# Patient Record
Sex: Female | Born: 1970 | Race: White | Hispanic: No | Smoking: Never smoker
Health system: Southern US, Community
[De-identification: ages and names within clinical notes are randomized; demographics above are authoritative.]

## PROBLEM LIST (undated history)

## (undated) DIAGNOSIS — Z9882 Breast implant status: Secondary | ICD-10-CM

## (undated) DIAGNOSIS — J302 Other seasonal allergic rhinitis: Secondary | ICD-10-CM

## (undated) DIAGNOSIS — Z923 Personal history of irradiation: Secondary | ICD-10-CM

## (undated) DIAGNOSIS — C50911 Malignant neoplasm of unspecified site of right female breast: Secondary | ICD-10-CM

## (undated) DIAGNOSIS — C50919 Malignant neoplasm of unspecified site of unspecified female breast: Secondary | ICD-10-CM

## (undated) HISTORY — PX: BREAST LUMPECTOMY: SHX2

## (undated) HISTORY — PX: TONSILLECTOMY: SUR1361

---

## 1998-03-13 ENCOUNTER — Other Ambulatory Visit: Admission: RE | Admit: 1998-03-13 | Discharge: 1998-03-13 | Payer: Self-pay | Admitting: *Deleted

## 1999-04-12 ENCOUNTER — Other Ambulatory Visit: Admission: RE | Admit: 1999-04-12 | Discharge: 1999-04-12 | Payer: Self-pay | Admitting: *Deleted

## 1999-05-14 ENCOUNTER — Encounter: Payer: Self-pay | Admitting: Emergency Medicine

## 1999-05-14 ENCOUNTER — Emergency Department (HOSPITAL_COMMUNITY): Admission: EM | Admit: 1999-05-14 | Discharge: 1999-05-14 | Payer: Self-pay | Admitting: Emergency Medicine

## 2000-09-11 ENCOUNTER — Other Ambulatory Visit: Admission: RE | Admit: 2000-09-11 | Discharge: 2000-09-11 | Payer: Self-pay | Admitting: *Deleted

## 2001-09-17 ENCOUNTER — Other Ambulatory Visit: Admission: RE | Admit: 2001-09-17 | Discharge: 2001-09-17 | Payer: Self-pay | Admitting: *Deleted

## 2002-05-07 ENCOUNTER — Other Ambulatory Visit: Admission: RE | Admit: 2002-05-07 | Discharge: 2002-05-07 | Payer: Self-pay | Admitting: Obstetrics & Gynecology

## 2002-10-28 ENCOUNTER — Ambulatory Visit (HOSPITAL_COMMUNITY): Admission: RE | Admit: 2002-10-28 | Discharge: 2002-10-28 | Payer: Self-pay | Admitting: Obstetrics & Gynecology

## 2002-10-28 ENCOUNTER — Encounter: Payer: Self-pay | Admitting: Obstetrics & Gynecology

## 2002-10-30 ENCOUNTER — Inpatient Hospital Stay (HOSPITAL_COMMUNITY): Admission: AD | Admit: 2002-10-30 | Discharge: 2002-11-01 | Payer: Self-pay | Admitting: Obstetrics & Gynecology

## 2004-06-25 ENCOUNTER — Other Ambulatory Visit: Admission: RE | Admit: 2004-06-25 | Discharge: 2004-06-25 | Payer: Self-pay | Admitting: Obstetrics and Gynecology

## 2004-07-25 HISTORY — PX: BREAST ENHANCEMENT SURGERY: SHX7

## 2005-07-07 ENCOUNTER — Other Ambulatory Visit: Admission: RE | Admit: 2005-07-07 | Discharge: 2005-07-07 | Payer: Self-pay | Admitting: Obstetrics and Gynecology

## 2014-10-01 ENCOUNTER — Other Ambulatory Visit: Payer: Self-pay | Admitting: Obstetrics and Gynecology

## 2014-10-01 DIAGNOSIS — R928 Other abnormal and inconclusive findings on diagnostic imaging of breast: Secondary | ICD-10-CM

## 2014-10-07 ENCOUNTER — Ambulatory Visit
Admission: RE | Admit: 2014-10-07 | Discharge: 2014-10-07 | Disposition: A | Discharge status: Home / Self Care | Payer: BLUE CROSS/BLUE SHIELD | Source: Ambulatory Visit | Attending: Obstetrics and Gynecology | Admitting: Obstetrics and Gynecology

## 2014-10-07 ENCOUNTER — Other Ambulatory Visit: Payer: Self-pay | Admitting: Obstetrics and Gynecology

## 2014-10-07 DIAGNOSIS — R928 Other abnormal and inconclusive findings on diagnostic imaging of breast: Secondary | ICD-10-CM

## 2014-10-14 ENCOUNTER — Ambulatory Visit: Payer: Self-pay | Admitting: Surgery

## 2014-10-14 DIAGNOSIS — R920 Mammographic microcalcification found on diagnostic imaging of breast: Secondary | ICD-10-CM

## 2014-10-14 NOTE — H&P (Signed)
History of Present Illness Kara Mccullough. Kara Menchaca MD; 10/14/2014 2:51 PM) Patient words: breast.  The patient is a 44 year old female who presents with a complaint of breast problems. Referred by Dr. Curlene Dolphin for abnormal microcalcifications. PCP - Dr. Shelia Media  This is a 44 year old female who presents after a routine screening mammogram. She had abnormal microcalcifications in the upper medial part of her right breast. An attempt was made at stereotactic biopsy was unsuccessful because of her retropectoral implants. The microcalcifications are not seen on ultrasound. She has a dominant group of calcifications spanning 11 mm at 1:00. There are a few clustered calcifications medial and anterior to this initial group. The total area measures 2.4 x 3.3 x 3.8 cm. These are suspicious for DCIS. She is now referred to [place] for consideration of seed-localized biopsy.  Menarche - 37 First pregnancy - 31 Breastfeed - yes Hormones - OCP 12+ years Family hx - negative  CLINICAL DATA: 44 year old patient recalled from screening for calcifications in the right breast. The patient is asymptomatic.  EXAM: DIGITAL DIAGNOSTIC RIGHT MAMMOGRAM WITH IMPLANTS, 3D TOMOSYNTHESIS WITH CAD  The patient has retropectoral implants. Implant displaced views were performed.  COMPARISON: 09/29/2014 and 08/15/2013  ACR Breast Density Category c: The breast tissue is heterogeneously dense, which may obscure small masses.  FINDINGS: Magnification views of the right breast demonstrate pleomorphic and casting type calcifications in the upper central to slightly medial right breast. The dominant group of calcifications spans 11 mm in the approximate 1 o'clock position of the right breast. Magnification views reveal additional but less numerous calcifications medial and anterior to the dominant group of calcifications. There are a few clustered calcifications in the immediate retroareolar right breast. In  total, the calcifications of concern span 2.4 cm transverse diameter by 3.3 cm superior to inferior via 3.8 cm AP diameter. These calcifications have a segmental distribution and are suspicious for ductal carcinoma in situ. Implant displaced whole breast tomogram views demonstrate no evidence of mass or distortion in the right breast.  Mammographic images were processed with CAD.  IMPRESSION: Suspicious appearance and distribution of calcifications in the right breast. These are suspicious for malignancy and biopsy is recommended.  RECOMMENDATION: Stereotactic biopsy of right breast calcifications is recommended. The patient is able to stay for attempt at biopsy today. Given the thin compression of the breast with implant displaced, stereotactic biopsy may not be possible, but will be attempted.  I have discussed the findings and recommendations with the patient. Results were also provided in writing at the conclusion of the visit. If applicable, a reminder letter will be sent to the patient regarding the next appointment.  BI-RADS CATEGORY 4: Suspicious.  Electronically Signed: By: Curlene Dolphin M.D. On: 10/07/2014 14:05   ADDENDUM REPORT: 10/07/2014 14:26  ADDENDUM: Stereotactic biopsy of right breast calcifications was attempted. The breast was placed in multiple different positions within the stereotactic unit. The breast was positioned, and stereotactic biopsy with the petite needle was planned, and an incision was made, but when attempting to advance the needle into its position for biopsy, the thin nature of the breast in combination with its compressibility, precluded with the ability to biopsy safely. Therefore, biopsy was canceled. I took the patient to the ultrasound room to see if calcifications were visible. I could not detect the cluster of calcifications on ultrasound and there was no mass.  Therefore, the patient will be referred to [facility] for consultation for excisional biopsy. She has an appointment  with Dr. Ninfa Linden on October 27, 2014.   Electronically Signed By: Curlene Dolphin M.D. On: 10/07/2014 14:26 Other Problems (Ammie Eversole, LPN; 11/30/3265 1:24 PM) No pertinent past medical history  Past Surgical History (Ammie Eversole, LPN; 5/80/9983 3:82 PM) Breast Augmentation Bilateral.  Diagnostic Studies History (Ammie Eversole, LPN; 11/27/3974 7:34 PM) Colonoscopy never Mammogram within last year Pap Smear 1-5 years ago  Allergies (Ammie Eversole, LPN; 1/93/7902 4:09 PM) No Known Drug Allergies 10/14/2014  Medication History (Ammie Eversole, LPN; 7/35/3299 2:42 PM) Seasonal IC (Oral) Active.  Social History (Ammie Eversole, LPN; 6/83/4196 2:22 PM) Alcohol use Occasional alcohol use. Caffeine use Coffee. No drug use Tobacco use Never smoker.  Family History (Aleatha Borer, LPN; 9/79/8921 1:94 PM) Cancer Family Members In General. Hypertension Family Members In General. Respiratory Condition Family Members In General. Thyroid problems Mother.  Pregnancy / Birth History Aleatha Borer, LPN; 1/74/0814 4:81 PM) Age at menarche 52 years. Contraceptive History Oral contraceptives. Gravida 1 Maternal age 47-35 Para 1 Regular periods     Review of Systems (Ammie Eversole LPN; 8/56/3149 7:02 PM) General Not Present- Appetite Loss, Chills, Fatigue, Fever, Night Sweats, Weight Gain and Weight Loss. Skin Not Present- Change in Wart/Mole, Dryness, Hives, Jaundice, New Lesions, Non-Healing Wounds, Rash and Ulcer. HEENT Not Present- Earache, Hearing Loss, Hoarseness, Nose Bleed, Oral Ulcers, Ringing in the Ears, Seasonal Allergies, Sinus Pain, Sore Throat, Visual Disturbances, Wears glasses/contact lenses and Yellow Eyes. Respiratory Not Present- Bloody sputum, Chronic Cough, Difficulty Breathing, Snoring and Wheezing. Breast Present- Breast Mass. Not Present- Breast Pain, Nipple  Discharge and Skin Changes. Cardiovascular Not Present- Chest Pain, Difficulty Breathing Lying Down, Leg Cramps, Palpitations, Rapid Heart Rate, Shortness of Breath and Swelling of Extremities. Gastrointestinal Not Present- Abdominal Pain, Bloating, Bloody Stool, Change in Bowel Habits, Chronic diarrhea, Constipation, Difficulty Swallowing, Excessive gas, Gets full quickly at meals, Hemorrhoids, Indigestion, Nausea, Rectal Pain and Vomiting. Female Genitourinary Not Present- Frequency, Nocturia, Painful Urination, Pelvic Pain and Urgency. Musculoskeletal Not Present- Back Pain, Joint Pain, Joint Stiffness, Muscle Pain, Muscle Weakness and Swelling of Extremities. Neurological Not Present- Decreased Memory, Fainting, Headaches, Numbness, Seizures, Tingling, Tremor, Trouble walking and Weakness. Psychiatric Not Present- Anxiety, Bipolar, Change in Sleep Pattern, Depression, Fearful and Frequent crying. Endocrine Not Present- Cold Intolerance, Excessive Hunger, Hair Changes, Heat Intolerance, Hot flashes and New Diabetes. Hematology Not Present- Easy Bruising, Excessive bleeding, Gland problems, HIV and Persistent Infections.  Vitals (Ammie Eversole LPN; 6/37/8588 5:02 PM) 10/14/2014 1:53 PM Weight: 139.2 lb Height: 64in Body Surface Area: 1.69 m Body Mass Index: 23.89 kg/m Temp.: 46F(Oral)  Pulse: 80 (Regular)  BP: 134/84 (Sitting, Left Arm, Standard)     Physical Exam Rodman Key K. Cristhian Vanhook MD; 10/14/2014 2:52 PM)  The physical exam findings are as follows: Note:WDWN in NAD HEENT: EOMI, sclera anicteric Neck: No masses, no thyromegaly Lungs: CTA bilaterally; normal respiratory effort Breasts - symmetric; healed circumareolar incisions; no palpable masses; no axillary lymphadenopathy CV: Regular rate and rhythm; no murmurs Abd: +bowel sounds, soft, non-tender, no masses Ext: Well-perfused; no edema Skin: Warm, dry; no sign of jaundice    Assessment & Plan Rodman Key K. Makylee Sanborn  MD; 10/14/2014 2:52 PM)  MAMMOGRAPHIC MICROCALCIFICATION FOUND ON DIAGNOSTIC IMAGING OF BREAST (793.81  R92.0)  Current Plans Schedule for Surgery - right radioactive seed localized lumpectomy. The surgical procedure has been discussed with the patient. Potential risks, benefits, alternative treatments, and expected outcomes have been explained. All of the patient's questions at this time have been answered. The likelihood of reaching the  patient's treatment goal is good. The patient understand the proposed surgical procedure and wishes to proceed. Note:Clustering of microcalcifications is suspicious for DCIS.   Kara Mccullough. Georgette Dover, MD, [facility]  General/ Trauma Surgery  10/14/2014 2:53 PM

## 2014-10-20 ENCOUNTER — Other Ambulatory Visit: Payer: Self-pay | Admitting: Surgery

## 2014-10-20 DIAGNOSIS — R920 Mammographic microcalcification found on diagnostic imaging of breast: Secondary | ICD-10-CM

## 2014-10-21 ENCOUNTER — Encounter (HOSPITAL_BASED_OUTPATIENT_CLINIC_OR_DEPARTMENT_OTHER): Payer: Self-pay | Admitting: *Deleted

## 2014-10-21 NOTE — Progress Notes (Signed)
No labs needed

## 2014-10-23 ENCOUNTER — Ambulatory Visit
Admission: RE | Admit: 2014-10-23 | Discharge: 2014-10-23 | Disposition: A | Discharge status: Home / Self Care | Payer: BLUE CROSS/BLUE SHIELD | Source: Ambulatory Visit | Attending: Surgery | Admitting: Surgery

## 2014-10-23 DIAGNOSIS — R920 Mammographic microcalcification found on diagnostic imaging of breast: Secondary | ICD-10-CM

## 2014-10-24 ENCOUNTER — Ambulatory Visit
Admission: RE | Admit: 2014-10-24 | Discharge: 2014-10-24 | Disposition: A | Discharge status: Home / Self Care | Payer: BLUE CROSS/BLUE SHIELD | Source: Ambulatory Visit | Attending: Surgery | Admitting: Surgery

## 2014-10-24 ENCOUNTER — Ambulatory Visit (HOSPITAL_BASED_OUTPATIENT_CLINIC_OR_DEPARTMENT_OTHER)
Admission: RE | Admit: 2014-10-24 | Discharge: 2014-10-24 | Disposition: A | Discharge status: Home / Self Care | Payer: BLUE CROSS/BLUE SHIELD | Source: Ambulatory Visit | Attending: Surgery | Admitting: Surgery

## 2014-10-24 ENCOUNTER — Ambulatory Visit (HOSPITAL_BASED_OUTPATIENT_CLINIC_OR_DEPARTMENT_OTHER): Payer: BLUE CROSS/BLUE SHIELD | Admitting: Anesthesiology

## 2014-10-24 ENCOUNTER — Encounter (HOSPITAL_BASED_OUTPATIENT_CLINIC_OR_DEPARTMENT_OTHER)
Admission: RE | Disposition: A | Discharge status: Home / Self Care | Payer: Self-pay | Source: Ambulatory Visit | Attending: Surgery

## 2014-10-24 ENCOUNTER — Encounter (HOSPITAL_BASED_OUTPATIENT_CLINIC_OR_DEPARTMENT_OTHER): Payer: Self-pay

## 2014-10-24 DIAGNOSIS — Z9889 Other specified postprocedural states: Secondary | ICD-10-CM | POA: Diagnosis not present

## 2014-10-24 DIAGNOSIS — Z809 Family history of malignant neoplasm, unspecified: Secondary | ICD-10-CM | POA: Diagnosis not present

## 2014-10-24 DIAGNOSIS — D0511 Intraductal carcinoma in situ of right breast: Secondary | ICD-10-CM | POA: Insufficient documentation

## 2014-10-24 DIAGNOSIS — Z79899 Other long term (current) drug therapy: Secondary | ICD-10-CM | POA: Diagnosis not present

## 2014-10-24 DIAGNOSIS — R920 Mammographic microcalcification found on diagnostic imaging of breast: Secondary | ICD-10-CM | POA: Insufficient documentation

## 2014-10-24 DIAGNOSIS — C50919 Malignant neoplasm of unspecified site of unspecified female breast: Secondary | ICD-10-CM

## 2014-10-24 HISTORY — PX: BREAST LUMPECTOMY WITH RADIOACTIVE SEED LOCALIZATION: SHX6424

## 2014-10-24 HISTORY — DX: Malignant neoplasm of unspecified site of unspecified female breast: C50.919

## 2014-10-24 HISTORY — DX: Other seasonal allergic rhinitis: J30.2

## 2014-10-24 LAB — POCT HEMOGLOBIN-HEMACUE: Hemoglobin: 11.6 g/dL — ABNORMAL LOW (ref 12.0–15.0)

## 2014-10-24 SURGERY — BREAST LUMPECTOMY WITH RADIOACTIVE SEED LOCALIZATION
Anesthesia: General | Site: Breast | Laterality: Right

## 2014-10-24 MED ORDER — LIDOCAINE HCL (CARDIAC) 20 MG/ML IV SOLN
INTRAVENOUS | Status: DC | PRN
  Administered 2014-10-24: 100 mg via INTRAVENOUS

## 2014-10-24 MED ORDER — CHLORHEXIDINE GLUCONATE 4 % EX LIQD: 1.0000 "application " | Freq: Once | CUTANEOUS | Status: DC

## 2014-10-24 MED ORDER — FENTANYL CITRATE 0.05 MG/ML IJ SOLN
INTRAMUSCULAR | Status: AC
  Filled 2014-10-24: qty 4

## 2014-10-24 MED ORDER — OXYCODONE-ACETAMINOPHEN 5-325 MG PO TABS: 1.0000 | ORAL_TABLET | ORAL | Status: DC | PRN

## 2014-10-24 MED ORDER — PROPOFOL 10 MG/ML IV BOLUS
INTRAVENOUS | Status: DC | PRN
  Administered 2014-10-24: 160 mg via INTRAVENOUS

## 2014-10-24 MED ORDER — MORPHINE SULFATE 2 MG/ML IJ SOLN: 2.0000 mg | INTRAMUSCULAR | Status: DC | PRN

## 2014-10-24 MED ORDER — MEPERIDINE HCL 25 MG/ML IJ SOLN: 6.2500 mg | INTRAMUSCULAR | Status: DC | PRN

## 2014-10-24 MED ORDER — BUPIVACAINE-EPINEPHRINE 0.25% -1:200000 IJ SOLN
INTRAMUSCULAR | Status: DC | PRN
  Administered 2014-10-24: 8 mL

## 2014-10-24 MED ORDER — ONDANSETRON HCL 4 MG/2ML IJ SOLN
INTRAMUSCULAR | Status: DC | PRN
  Administered 2014-10-24: 4 mg via INTRAVENOUS

## 2014-10-24 MED ORDER — MIDAZOLAM HCL 2 MG/2ML IJ SOLN
INTRAMUSCULAR | Status: AC
  Filled 2014-10-24: qty 2

## 2014-10-24 MED ORDER — LACTATED RINGERS IV SOLN
INTRAVENOUS | Status: DC
  Administered 2014-10-24: 07:00:00 via INTRAVENOUS

## 2014-10-24 MED ORDER — MIDAZOLAM HCL 2 MG/2ML IJ SOLN: 1.0000 mg | INTRAMUSCULAR | Status: DC | PRN

## 2014-10-24 MED ORDER — FENTANYL CITRATE 0.05 MG/ML IJ SOLN: 50.0000 ug | INTRAMUSCULAR | Status: DC | PRN

## 2014-10-24 MED ORDER — HYDROMORPHONE HCL 1 MG/ML IJ SOLN: 0.2500 mg | INTRAMUSCULAR | Status: DC | PRN

## 2014-10-24 MED ORDER — CEFAZOLIN SODIUM-DEXTROSE 2-3 GM-% IV SOLR
2.0000 g | INTRAVENOUS | Status: AC
  Administered 2014-10-24: 2 g via INTRAVENOUS

## 2014-10-24 MED ORDER — CEFAZOLIN SODIUM-DEXTROSE 2-3 GM-% IV SOLR
INTRAVENOUS | Status: AC
  Filled 2014-10-24: qty 50

## 2014-10-24 MED ORDER — FENTANYL CITRATE 0.05 MG/ML IJ SOLN
INTRAMUSCULAR | Status: DC | PRN
  Administered 2014-10-24: 100 ug via INTRAVENOUS

## 2014-10-24 MED ORDER — DEXAMETHASONE SODIUM PHOSPHATE 4 MG/ML IJ SOLN
INTRAMUSCULAR | Status: DC | PRN
  Administered 2014-10-24: 10 mg via INTRAVENOUS

## 2014-10-24 MED ORDER — ONDANSETRON HCL 4 MG/2ML IJ SOLN: 4.0000 mg | Freq: Once | INTRAMUSCULAR | Status: DC | PRN

## 2014-10-24 MED ORDER — BUPIVACAINE-EPINEPHRINE (PF) 0.25% -1:200000 IJ SOLN
INTRAMUSCULAR | Status: AC
  Filled 2014-10-24: qty 30

## 2014-10-24 MED ORDER — ONDANSETRON HCL 4 MG/2ML IJ SOLN: 4.0000 mg | INTRAMUSCULAR | Status: DC | PRN

## 2014-10-24 MED ORDER — GLYCOPYRROLATE 0.2 MG/ML IJ SOLN
INTRAMUSCULAR | Status: DC | PRN
  Administered 2014-10-24: 0.2 mg via INTRAVENOUS

## 2014-10-24 MED ORDER — MIDAZOLAM HCL 5 MG/5ML IJ SOLN
INTRAMUSCULAR | Status: DC | PRN
  Administered 2014-10-24: 2 mg via INTRAVENOUS

## 2014-10-24 SURGICAL SUPPLY — 57 items
APL SKNCLS STERI-STRIP NONHPOA (GAUZE/BANDAGES/DRESSINGS) ×1
APPLIER CLIP 9.375 MED OPEN (MISCELLANEOUS) ×3
APR CLP MED 9.3 20 MLT OPN (MISCELLANEOUS) ×1
BENZOIN TINCTURE PRP APPL 2/3 (GAUZE/BANDAGES/DRESSINGS) ×3 IMPLANT
BLADE HEX COATED 2.75 (ELECTRODE) ×3 IMPLANT
BLADE SURG 15 STRL LF DISP TIS (BLADE) ×1 IMPLANT
BLADE SURG 15 STRL SS (BLADE) ×3
CANISTER SUCT 1200ML W/VALVE (MISCELLANEOUS) ×3 IMPLANT
CHLORAPREP W/TINT 26ML (MISCELLANEOUS) ×3 IMPLANT
CLIP APPLIE 9.375 MED OPEN (MISCELLANEOUS) ×1 IMPLANT
CLOSURE WOUND 1/2 X4 (GAUZE/BANDAGES/DRESSINGS) ×1
COVER BACK TABLE 60X90IN (DRAPES) ×3 IMPLANT
COVER MAYO STAND STRL (DRAPES) ×3 IMPLANT
COVER PROBE W GEL 5X96 (DRAPES) ×3 IMPLANT
DECANTER SPIKE VIAL GLASS SM (MISCELLANEOUS) IMPLANT
DEVICE DUBIN W/COMP PLATE 8390 (MISCELLANEOUS) ×3 IMPLANT
DRAPE LAPAROTOMY 100X72 PEDS (DRAPES) ×3 IMPLANT
DRAPE UTILITY XL STRL (DRAPES) ×3 IMPLANT
DRSG TEGADERM 4X4.75 (GAUZE/BANDAGES/DRESSINGS) ×3 IMPLANT
ELECT REM PT RETURN 9FT ADLT (ELECTROSURGICAL) ×3
ELECTRODE REM PT RTRN 9FT ADLT (ELECTROSURGICAL) ×1 IMPLANT
GLOVE BIO SURGEON STRL SZ7 (GLOVE) ×3 IMPLANT
GLOVE BIOGEL PI IND STRL 6.5 (GLOVE) IMPLANT
GLOVE BIOGEL PI IND STRL 7.0 (GLOVE) IMPLANT
GLOVE BIOGEL PI IND STRL 7.5 (GLOVE) ×1 IMPLANT
GLOVE BIOGEL PI INDICATOR 6.5 (GLOVE) ×2
GLOVE BIOGEL PI INDICATOR 7.0 (GLOVE) ×2
GLOVE BIOGEL PI INDICATOR 7.5 (GLOVE) ×2
GLOVE ECLIPSE 6.5 STRL STRAW (GLOVE) ×2 IMPLANT
GLOVE EXAM NITRILE EXT CUFF MD (GLOVE) ×2 IMPLANT
GLOVE SURG SS PI 6.0 STRL IVOR (GLOVE) ×2 IMPLANT
GOWN STRL REUS W/ TWL LRG LVL3 (GOWN DISPOSABLE) ×2 IMPLANT
GOWN STRL REUS W/TWL LRG LVL3 (GOWN DISPOSABLE) ×9
KIT MARKER MARGIN INK (KITS) ×3 IMPLANT
NDL HYPO 25X1 1.5 SAFETY (NEEDLE) ×1 IMPLANT
NEEDLE HYPO 25X1 1.5 SAFETY (NEEDLE) ×3 IMPLANT
NS IRRIG 1000ML POUR BTL (IV SOLUTION) ×3 IMPLANT
PACK BASIN DAY SURGERY FS (CUSTOM PROCEDURE TRAY) ×3 IMPLANT
PENCIL BUTTON HOLSTER BLD 10FT (ELECTRODE) ×3 IMPLANT
SLEEVE SCD COMPRESS KNEE MED (MISCELLANEOUS) ×3 IMPLANT
SPONGE GAUZE 2X2 8PLY STER LF (GAUZE/BANDAGES/DRESSINGS) ×1
SPONGE GAUZE 2X2 8PLY STRL LF (GAUZE/BANDAGES/DRESSINGS) ×1 IMPLANT
SPONGE GAUZE 4X4 12PLY STER LF (GAUZE/BANDAGES/DRESSINGS) IMPLANT
SPONGE LAP 18X18 X RAY DECT (DISPOSABLE) IMPLANT
SPONGE LAP 4X18 X RAY DECT (DISPOSABLE) ×3 IMPLANT
STRIP CLOSURE SKIN 1/2X4 (GAUZE/BANDAGES/DRESSINGS) ×2 IMPLANT
SUT MON AB 4-0 PC3 18 (SUTURE) ×3 IMPLANT
SUT SILK 2 0 SH (SUTURE) IMPLANT
SUT VIC AB 3-0 SH 27 (SUTURE) ×3
SUT VIC AB 3-0 SH 27X BRD (SUTURE) ×1 IMPLANT
SYR BULB 3OZ (MISCELLANEOUS) IMPLANT
SYR CONTROL 10ML LL (SYRINGE) ×3 IMPLANT
TOWEL OR 17X24 6PK STRL BLUE (TOWEL DISPOSABLE) ×3 IMPLANT
TOWEL OR NON WOVEN STRL DISP B (DISPOSABLE) ×1 IMPLANT
TUBE CONNECTING 20'X1/4 (TUBING) ×1
TUBE CONNECTING 20X1/4 (TUBING) ×2 IMPLANT
YANKAUER SUCT BULB TIP NO VENT (SUCTIONS) ×3 IMPLANT

## 2014-10-24 NOTE — Anesthesia Procedure Notes (Signed)
Procedure Name: LMA Insertion Date/Time: 10/24/2014 7:31 AM Performed by: Lyndee Leo Pre-anesthesia Checklist: Patient identified, Emergency Drugs available, Suction available and Patient being monitored Patient Re-evaluated:Patient Re-evaluated prior to inductionOxygen Delivery Method: Circle System Utilized Preoxygenation: Pre-oxygenation with 100% oxygen Intubation Type: IV induction Ventilation: Mask ventilation without difficulty LMA: LMA inserted LMA Size: 4.0 Number of attempts: 1 Airway Equipment and Method: Bite block Placement Confirmation: positive ETCO2 Tube secured with: Tape Dental Injury: Teeth and Oropharynx as per pre-operative assessment

## 2014-10-24 NOTE — Discharge Instructions (Signed)
[facility] °Office Phone Number [phone] ° °BREAST BIOPSY/ PARTIAL MASTECTOMY: POST OP INSTRUCTIONS ° °Always review your discharge instruction sheet given to you by the facility where your surgery was performed. ° °IF YOU HAVE DISABILITY OR FAMILY LEAVE FORMS, YOU MUST BRING THEM TO THE OFFICE FOR PROCESSING.  DO NOT GIVE THEM TO YOUR DOCTOR. ° °1. A prescription for pain medication may be given to you upon discharge.  Take your pain medication as prescribed, if needed.  If narcotic pain medicine is not needed, then you may take acetaminophen (Tylenol) or ibuprofen (Advil) as needed. °2. Take your usually prescribed medications unless otherwise directed °3. If you need a refill on your pain medication, please contact your pharmacy.  They will contact our office to request authorization.  Prescriptions will not be filled after 5pm or on week-ends. °4. You should eat very light the first 24 hours after surgery, such as soup, crackers, pudding, etc.  Resume your normal diet the day after surgery. °5. Most patients will experience some swelling and bruising in the breast.  Ice packs and a good support bra will help.  Swelling and bruising can take several days to resolve.  °6. It is common to experience some constipation if taking pain medication after surgery.  Increasing fluid intake and taking a stool softener will usually help or prevent this problem from occurring.  A mild laxative (Milk of Magnesia or Miralax) should be taken according to package directions if there are no bowel movements after 48 hours. °7. Unless discharge instructions indicate otherwise, you may remove your bandages 48 hours after surgery, and you may shower at that time.  You will have steri-strips (small skin tapes) in place directly over the incision.  These strips should be left on the skin for 7-10 days.   Any sutures or staples will be removed at the office during your follow-up visit. °8. ACTIVITIES:  You may resume  regular daily activities (gradually increasing) beginning the next day.  Wearing a good support bra or sports bra minimizes pain and swelling.  You may have sexual intercourse when it is comfortable. °a. You may drive when you no longer are taking prescription pain medication, you can comfortably wear a seatbelt, and you can safely maneuver your car and apply brakes. °b. RETURN TO WORK:  1-2 weeks °9. You should see your doctor in the office for a follow-up appointment approximately two weeks after your surgery.  Your doctor’s nurse will typically make your follow-up appointment when she calls you with your pathology report.  Expect your pathology report 2-3 business days after your surgery.  You may call to check if you do not hear from us after three days. °10. OTHER INSTRUCTIONS: _______________________________________________________________________________________________ _____________________________________________________________________________________________________________________________________ °_____________________________________________________________________________________________________________________________________ °_____________________________________________________________________________________________________________________________________ ° °WHEN TO CALL YOUR DOCTOR: °1. Fever over 101.0 °2. Nausea and/or vomiting. °3. Extreme swelling or bruising. °4. Continued bleeding from incision. °5. Increased pain, redness, or drainage from the incision. ° °The clinic staff is available to answer your questions during regular business hours.  Please don’t hesitate to call and ask to speak to one of the nurses for clinical concerns.  If you have a medical emergency, go to the nearest emergency room or call 911.  A surgeon from [facility] is always on call at the hospital. ° °For further questions, please visit [email or web address]  ° ° ° °Post Anesthesia Home Care  Instructions ° °Activity: °Get plenty of rest for the remainder of the day. A responsible adult should stay   with you for 24 hours following the procedure.  °For the next 24 hours, DO NOT: °-Drive a car °-Operate machinery °-Drink alcoholic beverages °-Take any medication unless instructed by your physician °-Make any legal decisions or sign important papers. ° °Meals: °Start with liquid foods such as gelatin or soup. Progress to regular foods as tolerated. Avoid greasy, spicy, heavy foods. If nausea and/or vomiting occur, drink only clear liquids until the nausea and/or vomiting subsides. Call your physician if vomiting continues. ° °Special Instructions/Symptoms: °Your throat may feel dry or sore from the anesthesia or the breathing tube placed in your throat during surgery. If this causes discomfort, gargle with warm salt water. The discomfort should disappear within 24 hours. ° °If you had a scopolamine patch placed behind your ear for the management of post- operative nausea and/or vomiting: ° °1. The medication in the patch is effective for 72 hours, after which it should be removed.  Wrap patch in a tissue and discard in the trash. Wash hands thoroughly with soap and water. °2. You may remove the patch earlier than 72 hours if you experience unpleasant side effects which may include dry mouth, dizziness or visual disturbances. °3. Avoid touching the patch. Wash your hands with soap and water after contact with the patch. °  ° °

## 2014-10-24 NOTE — Transfer of Care (Signed)
Immediate Anesthesia Transfer of Care Note  Patient: Kara Mccullough  Procedure(s) Performed: Procedure(s): BREAST LUMPECTOMY WITH RADIOACTIVE SEED LOCALIZATION (Right)  Patient Location: PACU  Anesthesia Type:General  Level of Consciousness: awake, sedated and patient cooperative  Airway & Oxygen Therapy: Patient Spontanous Breathing and Patient connected to face mask oxygen  Post-op Assessment: Report given to RN and Post -op Vital signs reviewed and stable  Post vital signs: Reviewed and stable  Last Vitals:  Filed Vitals:   10/24/14 0630  BP: 140/72  Pulse: 63  Temp: 36.9 C  Resp: 16    Complications: No apparent anesthesia complications

## 2014-10-24 NOTE — Anesthesia Preprocedure Evaluation (Signed)
Anesthesia Evaluation  Patient identified by MRN, date of birth, ID band Patient awake    Reviewed: Allergy & Precautions, NPO status , Patient's Chart, lab work & pertinent test results  Airway Mallampati: I  TM Distance: >3 FB Neck ROM: Full    Dental   Pulmonary          Cardiovascular     Neuro/Psych    GI/Hepatic   Endo/Other    Renal/GU      Musculoskeletal   Abdominal   Peds  Hematology   Anesthesia Other Findings   Reproductive/Obstetrics                             Anesthesia Physical Anesthesia Plan  ASA: II  Anesthesia Plan: General   Post-op Pain Management:    Induction: Intravenous  Airway Management Planned: LMA  Additional Equipment:   Intra-op Plan:   Post-operative Plan: Extubation in OR  Informed Consent: I have reviewed the patients History and Physical, chart, labs and discussed the procedure including the risks, benefits and alternatives for the proposed anesthesia with the patient or authorized representative who has indicated his/her understanding and acceptance.     Plan Discussed with: CRNA and Surgeon  Anesthesia Plan Comments:         Anesthesia Quick Evaluation

## 2014-10-24 NOTE — Op Note (Signed)
Pre-op Diagnosis:  Suspicious microcalcifications - right mammogram Post-op Diagnosis:  Same Procedure:  Right radioactive-seed localized lumpectomy Surgeon:  Payson Evrard K. Anesthesia:   Gen - LMA Indications:  This is a 44 year old female who presents with abnormal microcalcifications in the upper part of her right breast. She has retropectoral implants. An attempt at stereotactic biopsy was unsuccessful so she presents now for lumpectomy for diagnosis.  Description of procedure: A radioactive seed had been placed previously by radiology. The presence of the seed was confirmed using the neoprobe in the holding area. The patient was brought to the operating room and placed in a supine position on the operating room table. After an adequate level of general anesthesia was obtained her right breast was prepped with ChloraPrep and draped in sterile fashion. A timeout was taken to ensure the proper patient and proper procedure. The seed was localized just above the edge of the areola.  I made a curvilinear incision around the top of the areola. Dissection was carried down through the dermis with cautery. We raised margins around the seed using the neoprobe for guidance. We dissected down until we were deep to the seed. This brought [place] to a very sent layer of pectoral muscle. We did not enter the capsule of the implant. The specimen was removed entirely. There was no residual background activity. The specimen was oriented with a paint kit. Specimen mammogram confirmed the seed within the center of the specimen. This was sent for pathology. The wound was irrigated and inspected for hemostasis. The wound was closed with 3-0 Vicryl and 4-0 Monocryl. Steri-Strips and clean dressings were applied. The patient was then extubated and brought to the recovery room in stable condition. All sponge, instrument, and needle counts are correct.  Imogene Burn. Georgette Dover, MD, [facility]  General/ Trauma  Surgery  10/24/2014 8:34 AM

## 2014-10-24 NOTE — Interval H&P Note (Signed)
History and Physical Interval Note:  10/24/2014 1:20 AM  Kara Mccullough  has presented today for surgery, with the diagnosis of Right Breast Microcalcifications  The various methods of treatment have been discussed with the patient and family. After consideration of risks, benefits and other options for treatment, the patient has consented to  Procedure(s): BREAST LUMPECTOMY WITH RADIOACTIVE SEED LOCALIZATION (Right) as a surgical intervention .  The patient's history has been reviewed, patient examined, no change in status, stable for surgery.  I have reviewed the patient's chart and labs.  Questions were answered to the patient's satisfaction.     [name]

## 2014-10-24 NOTE — H&P (View-Only) (Signed)
History of Present Illness Kara Mccullough. Kara Tamburri MD; 10/14/2014 2:51 PM) Patient words: breast.  The patient is a 44 year old female who presents with a complaint of breast problems. Referred by Dr. Curlene Dolphin for abnormal microcalcifications. PCP - Dr. Shelia Media  This is a 44 year old female who presents after a routine screening mammogram. She had abnormal microcalcifications in the upper medial part of her right breast. An attempt was made at stereotactic biopsy was unsuccessful because of her retropectoral implants. The microcalcifications are not seen on ultrasound. She has a dominant group of calcifications spanning 11 mm at 1:00. There are a few clustered calcifications medial and anterior to this initial group. The total area measures 2.4 x 3.3 x 3.8 cm. These are suspicious for DCIS. She is now referred to [place] for consideration of seed-localized biopsy.  Menarche - 60 First pregnancy - 31 Breastfeed - yes Hormones - OCP 12+ years Family hx - negative  CLINICAL DATA: 44 year old patient recalled from screening for calcifications in the right breast. The patient is asymptomatic.  EXAM: DIGITAL DIAGNOSTIC RIGHT MAMMOGRAM WITH IMPLANTS, 3D TOMOSYNTHESIS WITH CAD  The patient has retropectoral implants. Implant displaced views were performed.  COMPARISON: 09/29/2014 and 08/15/2013  ACR Breast Density Category c: The breast tissue is heterogeneously dense, which may obscure small masses.  FINDINGS: Magnification views of the right breast demonstrate pleomorphic and casting type calcifications in the upper central to slightly medial right breast. The dominant group of calcifications spans 11 mm in the approximate 1 o'clock position of the right breast. Magnification views reveal additional but less numerous calcifications medial and anterior to the dominant group of calcifications. There are a few clustered calcifications in the immediate retroareolar right breast. In  total, the calcifications of concern span 2.4 cm transverse diameter by 3.3 cm superior to inferior via 3.8 cm AP diameter. These calcifications have a segmental distribution and are suspicious for ductal carcinoma in situ. Implant displaced whole breast tomogram views demonstrate no evidence of mass or distortion in the right breast.  Mammographic images were processed with CAD.  IMPRESSION: Suspicious appearance and distribution of calcifications in the right breast. These are suspicious for malignancy and biopsy is recommended.  RECOMMENDATION: Stereotactic biopsy of right breast calcifications is recommended. The patient is able to stay for attempt at biopsy today. Given the thin compression of the breast with implant displaced, stereotactic biopsy may not be possible, but will be attempted.  I have discussed the findings and recommendations with the patient. Results were also provided in writing at the conclusion of the visit. If applicable, a reminder letter will be sent to the patient regarding the next appointment.  BI-RADS CATEGORY 4: Suspicious.  Electronically Signed: By: Curlene Dolphin M.D. On: 10/07/2014 14:05   ADDENDUM REPORT: 10/07/2014 14:26  ADDENDUM: Stereotactic biopsy of right breast calcifications was attempted. The breast was placed in multiple different positions within the stereotactic unit. The breast was positioned, and stereotactic biopsy with the petite needle was planned, and an incision was made, but when attempting to advance the needle into its position for biopsy, the thin nature of the breast in combination with its compressibility, precluded with the ability to biopsy safely. Therefore, biopsy was canceled. I took the patient to the ultrasound room to see if calcifications were visible. I could not detect the cluster of calcifications on ultrasound and there was no mass.  Therefore, the patient will be referred to Kara Mccullough  Mccullough for consultation for excisional biopsy. She has an appointment  with Kara Mccullough on October 27, 2014.   Electronically Signed By: Curlene Dolphin M.D. On: 10/07/2014 14:26 Other Problems (Ammie Eversole, LPN; 10/24/270 5:36 PM) No pertinent past medical history  Past Surgical History (Ammie Eversole, LPN; 6/44/0347 4:25 PM) Breast Augmentation Bilateral.  Diagnostic Studies History (Ammie Eversole, LPN; 9/56/3875 6:43 PM) Colonoscopy never Mammogram within last year Pap Smear 1-5 years ago  Allergies (Ammie Eversole, LPN; 10/21/5186 4:16 PM) No Known Drug Allergies 10/14/2014  Medication History (Ammie Eversole, LPN; 12/29/3014 0:10 PM) Seasonal IC (Oral) Active.  Social History (Ammie Eversole, LPN; 9/32/3557 3:22 PM) Alcohol use Occasional alcohol use. Caffeine use Coffee. No drug use Tobacco use Never smoker.  Family History (Aleatha Borer, LPN; 0/25/4270 6:23 PM) Cancer Family Members In General. Hypertension Family Members In General. Respiratory Condition Family Members In General. Thyroid problems Mother.  Pregnancy / Birth History Aleatha Borer, LPN; 7/62/8315 1:76 PM) Age at menarche 52 years. Contraceptive History Oral contraceptives. Gravida 1 Maternal age 62-35 Para 1 Regular periods     Review of Systems (Ammie Eversole LPN; 1/60/7371 0:62 PM) General Not Present- Appetite Loss, Chills, Fatigue, Fever, Night Sweats, Weight Gain and Weight Loss. Skin Not Present- Change in Wart/Mole, Dryness, Hives, Jaundice, New Lesions, Non-Healing Wounds, Rash and Ulcer. HEENT Not Present- Earache, Hearing Loss, Hoarseness, Nose Bleed, Oral Ulcers, Ringing in the Ears, Seasonal Allergies, Sinus Pain, Sore Throat, Visual Disturbances, Wears glasses/contact lenses and Yellow Eyes. Respiratory Not Present- Bloody sputum, Chronic Cough, Difficulty Breathing, Snoring and Wheezing. Breast Present- Breast Mass. Not Present- Breast Pain, Nipple  Discharge and Skin Changes. Cardiovascular Not Present- Chest Pain, Difficulty Breathing Lying Down, Leg Cramps, Palpitations, Rapid Heart Rate, Shortness of Breath and Swelling of Extremities. Gastrointestinal Not Present- Abdominal Pain, Bloating, Bloody Stool, Change in Bowel Habits, Chronic diarrhea, Constipation, Difficulty Swallowing, Excessive gas, Gets full quickly at meals, Hemorrhoids, Indigestion, Nausea, Rectal Pain and Vomiting. Female Genitourinary Not Present- Frequency, Nocturia, Painful Urination, Pelvic Pain and Urgency. Musculoskeletal Not Present- Back Pain, Joint Pain, Joint Stiffness, Muscle Pain, Muscle Weakness and Swelling of Extremities. Neurological Not Present- Decreased Memory, Fainting, Headaches, Numbness, Seizures, Tingling, Tremor, Trouble walking and Weakness. Psychiatric Not Present- Anxiety, Bipolar, Change in Sleep Pattern, Depression, Fearful and Frequent crying. Endocrine Not Present- Cold Intolerance, Excessive Hunger, Hair Changes, Heat Intolerance, Hot flashes and New Diabetes. Hematology Not Present- Easy Bruising, Excessive bleeding, Gland problems, HIV and Persistent Infections.  Vitals (Ammie Eversole LPN; 6/94/8546 2:70 PM) 10/14/2014 1:53 PM Weight: 139.2 lb Height: 64in Body Surface Area: 1.69 m Body Mass Index: 23.89 kg/m Temp.: 59F(Oral)  Pulse: 80 (Regular)  BP: 134/84 (Sitting, Left Arm, Standard)     Physical Exam Rodman Key K. Danaria Larsen MD; 10/14/2014 2:52 PM)  The physical exam findings are as follows: Note:WDWN in NAD HEENT: EOMI, sclera anicteric Neck: No masses, no thyromegaly Lungs: CTA bilaterally; normal respiratory effort Breasts - symmetric; healed circumareolar incisions; no palpable masses; no axillary lymphadenopathy CV: Regular rate and rhythm; no murmurs Abd: +bowel sounds, soft, non-tender, no masses Ext: Well-perfused; no edema Skin: Warm, dry; no sign of jaundice    Assessment & Plan Rodman Key K. Zoei Amison  MD; 10/14/2014 2:52 PM)  MAMMOGRAPHIC MICROCALCIFICATION FOUND ON DIAGNOSTIC IMAGING OF BREAST (793.81  R92.0)  Current Plans Schedule for Mccullough - right radioactive seed localized lumpectomy. The surgical procedure has been discussed with the patient. Potential risks, benefits, alternative treatments, and expected outcomes have been explained. All of the patient's questions at this time have been answered. The likelihood of reaching the  patient's treatment goal is good. The patient understand the proposed surgical procedure and wishes to proceed. Note:Clustering of microcalcifications is suspicious for DCIS.   Kara Mccullough. Georgette Dover, MD, [facility] Mccullough  General/ Trauma Mccullough  10/14/2014 2:53 PM

## 2014-10-24 NOTE — Anesthesia Postprocedure Evaluation (Signed)
Anesthesia Post Note  Patient: Kara Mccullough  Procedure(s) Performed: Procedure(s) (LRB): BREAST LUMPECTOMY WITH RADIOACTIVE SEED LOCALIZATION (Right)  Anesthesia type: general  Patient location: PACU  Post pain: Pain level controlled  Post assessment: Patient's Cardiovascular Status Stable  Last Vitals:  Filed Vitals:   10/24/14 0920  BP: 113/78  Pulse: 52  Temp: 36.4 C  Resp: 20    Post vital signs: Reviewed and stable  Level of consciousness: sedated  Complications: No apparent anesthesia complications

## 2014-10-27 ENCOUNTER — Other Ambulatory Visit: Payer: Self-pay | Admitting: Surgery

## 2014-10-27 DIAGNOSIS — D0511 Intraductal carcinoma in situ of right breast: Secondary | ICD-10-CM

## 2014-10-28 ENCOUNTER — Encounter (HOSPITAL_BASED_OUTPATIENT_CLINIC_OR_DEPARTMENT_OTHER): Payer: Self-pay | Admitting: Surgery

## 2014-10-31 ENCOUNTER — Encounter: Payer: Self-pay | Admitting: Radiation Oncology

## 2014-10-31 ENCOUNTER — Ambulatory Visit
Admission: RE | Admit: 2014-10-31 | Discharge: 2014-10-31 | Disposition: A | Discharge status: Home / Self Care | Payer: BLUE CROSS/BLUE SHIELD | Source: Ambulatory Visit | Attending: Surgery | Admitting: Surgery

## 2014-10-31 DIAGNOSIS — D0511 Intraductal carcinoma in situ of right breast: Secondary | ICD-10-CM

## 2014-10-31 NOTE — Progress Notes (Signed)
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 - DUCTAL CARCINOMA IN SITU IS BROADLY PRESENT AT THE POSTERIOR MARGIN.  Receptor Status: ER(), PR (), Her2-neu () Pending- path dept called today  This is a 44 year old female who presents after a routine screening mammogram. She had abnormal microcalcifications in the upper medial part of her right breast. An attempt was made at stereotactic biopsy was unsuccessful because of her retropectoral implants. The microcalcifications are not seen on ultrasound. She has a dominant group of calcifications spanning 11 mm at 1:00. There are a few clustered calcifications medial and anterior to this initial group. The total area measures 2.4 x 3.3 x 3.8 cm. These are suspicious for DCIS.  Past/Anticipated interventions by surgeon, if any: Dr. Donnie Mesa: Lumpectomy of Right Breast  Past/Anticipated interventions by medical oncology, if any: Unknown presently  Lymphedema issues, if any:  no  Pain issues, if any:   no  SAFETY ISSUES:  Prior radiation? NO  Pacemaker/ICD? NO   Possible current pregnancy? NO, last menses 09/16/14, states she "only has 4 periods a year"  Is the patient on methotrexate? No  Current Complaints / other details:  Ms. Yarrow has retropectoral implants. One son, age 55. Patient works for [employer].        Deirdre Evener, RN 10/31/2014,6:46 PM

## 2014-11-05 ENCOUNTER — Encounter: Payer: Self-pay | Admitting: Radiation Oncology

## 2014-11-06 ENCOUNTER — Encounter: Payer: Self-pay | Admitting: Radiation Oncology

## 2014-11-06 ENCOUNTER — Other Ambulatory Visit: Payer: BLUE CROSS/BLUE SHIELD

## 2014-11-06 ENCOUNTER — Ambulatory Visit
Admission: RE | Admit: 2014-11-06 | Discharge: 2014-11-06 | Disposition: A | Discharge status: Home / Self Care | Payer: BLUE CROSS/BLUE SHIELD | Source: Ambulatory Visit | Attending: Radiation Oncology | Admitting: Radiation Oncology

## 2014-11-06 ENCOUNTER — Ambulatory Visit (HOSPITAL_BASED_OUTPATIENT_CLINIC_OR_DEPARTMENT_OTHER): Payer: BLUE CROSS/BLUE SHIELD | Admitting: Genetic Counselor

## 2014-11-06 ENCOUNTER — Telehealth: Payer: Self-pay | Admitting: *Deleted

## 2014-11-06 ENCOUNTER — Ambulatory Visit: Payer: BLUE CROSS/BLUE SHIELD | Admitting: Radiation Oncology

## 2014-11-06 ENCOUNTER — Ambulatory Visit: Payer: BLUE CROSS/BLUE SHIELD

## 2014-11-06 VITALS — BP 134/86 | HR 73 | Temp 99.0°F | Resp 16 | Wt 138.6 lb

## 2014-11-06 DIAGNOSIS — D0511 Intraductal carcinoma in situ of right breast: Secondary | ICD-10-CM | POA: Insufficient documentation

## 2014-11-06 DIAGNOSIS — Z808 Family history of malignant neoplasm of other organs or systems: Secondary | ICD-10-CM | POA: Diagnosis not present

## 2014-11-06 DIAGNOSIS — Z315 Encounter for genetic counseling: Secondary | ICD-10-CM | POA: Diagnosis not present

## 2014-11-06 DIAGNOSIS — Z801 Family history of malignant neoplasm of trachea, bronchus and lung: Secondary | ICD-10-CM

## 2014-11-06 DIAGNOSIS — C50919 Malignant neoplasm of unspecified site of unspecified female breast: Secondary | ICD-10-CM

## 2014-11-06 HISTORY — DX: Malignant neoplasm of unspecified site of unspecified female breast: C50.919

## 2014-11-06 HISTORY — DX: Malignant neoplasm of unspecified site of right female breast: C50.911

## 2014-11-06 HISTORY — DX: Breast implant status: Z98.82

## 2014-11-06 NOTE — Telephone Encounter (Signed)
Scheduled and confirmed genetic testing today 4/14 at 1200.

## 2014-11-06 NOTE — Telephone Encounter (Signed)
SPOKE WITH DAWN STEWART AND SHE TOLD ME THAT MS. Ullom HAS AN APPT. WITH GENETIC COUNSELOR TODAY @ 12 PM AND THE PATIENT IS AWARE OF THIS APPT.

## 2014-11-06 NOTE — Progress Notes (Signed)
Please see the Nurse Progress Note in the MD Initial Consult Encounter for this patient. 

## 2014-11-06 NOTE — Progress Notes (Addendum)
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 female who is seen today through the courtesy of Dr.  Verita Lamb for evaluation of her DCIS of the right breast.  At the time of a screening mammogram she was noted to have suspicious right breast calcifications.  She is said to have a retropectoral implant and implant displaced views were performed.  Additional views with 3-D on 10/07/2014 showed suspicious pleomorphic calcifications within the upper central to slightly medial right breast.  The dominant group of calcifications and 1.1 cm at approximately 1:00.  There were additional but less numerous calcifications medial and anterior to the dominant group of calcifications.  There were a few clustered calcifications in the immediate retroareolar right breast.  In total the calcifications of concern measured 2.4x  3.3 x 3.8 cm.  Implant displaced views were without evidence of a mass wor distortion of the right breast.  A stereotactic biopsy was not felt to be feasible because of the location, and she underwent an excisional biopsy by Dr. Georgette Dover on 10/24/2014.  He describes going down to the pectoralis fascia and actually visualizing the implant.  On review of her pathology, she was found to have high-grade DCIS with calcifications spanning 1.8 cm.  DCIS was seen broadly at the posterior margin.  Receptor data are not yet available.  Postoperative 3D mammography on 10/31/2014 again showed a right retropectoral saline implant with surgical changes but no residual calcifications.  She is doing well postoperatively.  She has not yet been seen by medical  oncology or the genetic counselor.  PREVIOUS RADIATION THERAPY: No   PAST MEDICAL HISTORY:  has a past medical history of Seasonal allergies; Breast cancer, right breast; breast implants, bilateral; and Breast cancer.     PAST SURGICAL HISTORY:  Past Surgical History  Procedure Laterality Date  . Tonsillectomy    . Breast enhancement surgery  2006    implants  . Breast lumpectomy with radioactive seed localization Right 10/24/2014    Procedure: BREAST LUMPECTOMY WITH RADIOACTIVE SEED LOCALIZATION;  Surgeon: Donnie Mesa, MD;  Location: [facility];  Service: General;  Laterality: Right;     FAMILY HISTORY: family history includes Cancer in her maternal grandmother, mother, and paternal uncle; Thyroid disease in her mother. Her father is 41, in her mother is 64, both alive and well.  No family history of ovarian or breast cancer.   SOCIAL HISTORY:  reports that she has never smoked. She does not have any smokeless tobacco history on file. She reports that she drinks alcohol. She reports that she does not use illicit drugs.  Married, 56 year old son.  She works as an Optometrist.   ALLERGIES: Review of patient's allergies indicates no known allergies.   MEDICATIONS:  Current Outpatient Prescriptions  Medication Sig Dispense Refill  . Biotin 10 MG CAPS Take by mouth.    . cetirizine (ZYRTEC) 10 MG tablet Take 10 mg by mouth daily.    . Misc Natural Products (SEASONAL IC PO) Take 1 tablet by mouth every morning. BC    . oxyCODONE-acetaminophen (  PERCOCET/ROXICET) 5-325 MG per tablet Take 1 tablet by mouth every 4 (four) hours as needed for severe pain. 40 tablet 0   No current facility-administered medications for this encounter.     REVIEW OF SYSTEMS:  Pertinent items are noted in HPI.    PHYSICAL EXAM:  weight is 138 lb 9.6 oz (62.869 kg). Her oral temperature is 99 F (37.2 C). Her blood pressure is 134/86 and her pulse is 73. Her respiration is 16.   Alert and  oriented 44 year old white female appearing her stated age.  Head and neck examination: Grossly unremarkable.  Nodes: Without palpable cervical, supraclavicular, or axillary lymphadenopathy.  Chest: Lungs clear.  Breasts: Bilateral implants.  There is a periareolar wound along the right breast extending from approximately 11 to 2:00 which is healing well.  No masses are appreciated.  Left breast without masses or lesions.  Abdomen without hepatomegaly.  Extremities: Without edema.   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  We do not yet have her hormone receptor data.  She was presented at the morning conference yesterday.  The size of her DCIS appreciated on mammography is discordant with the pathologic extent of her disease.  However, there are no residual microcalcifications seen on her post biopsy mammogram.  It appears that the broadly based posterior margin is actually the pectoralis fascia/muscle which is thinned out.  There is no more tissue to take.  We reviewed local management options which include mastectomy versus partial mastectomy followed by radiation therapy.  Even a mastectomy does not address the issue of a broadly based posterior margin.  I shared her case with Dr. Basilio Cairo, the chief of breast radiation oncology at [facility], and she agrees with me that she is a candidate for breast preservation and there is little benefit for mastectomy.  We discussed the potential acute and late toxicities of radiation therapy including capsular fibrosis and the possible need to remove her implant.  We discussed the fact that the risk for local recurrence presumably in the 5-10% range even with radiation therapy and she will require close follow-up with mammography.  Her expected overall  survival approaches 99%.  I'm requesting genetic counseling, and I assume that she is scheduled see medical oncology as well for discussion of antiestrogen therapy following completion of radiation therapy.  From a technical standpoint, she will receive standard fractionation radiation therapy to decrease the risk for right breast/implant  fibrosis.  She tells me that she plans on going on an annual family beach vacation the week of June 12, and I'll try to expedite her genetic counseling so we can have BRCA1/2 results back by the time we begin radiation therapy.  Consent is signed today.  PLAN: As discussed above.  We will get her set up for CT simulation will be know the timing of her genetic testing. I spent 60  minutes face to face with the patient and more than 50% of that time was spent in counseling and/or coordination of care.  Addendum: We are just informed that the "preliminary" estrogen receptor is 7% and progesterone receptor is 0.

## 2014-11-07 ENCOUNTER — Encounter: Payer: Self-pay | Admitting: Genetic Counselor

## 2014-11-07 ENCOUNTER — Telehealth: Payer: Self-pay | Admitting: Genetic Counselor

## 2014-11-07 DIAGNOSIS — C50919 Malignant neoplasm of unspecified site of unspecified female breast: Secondary | ICD-10-CM | POA: Insufficient documentation

## 2014-11-07 NOTE — Progress Notes (Signed)
Kara Mccullough Visit  REFERRING PROVIDER: Deland Pretty, MD [address] [place], [place] [address]  PRIMARY PROVIDER:  Horatio Pel, MD  PRIMARY REASON FOR VISIT:  1. Breast cancer, unspecified laterality     HISTORY OF PRESENT ILLNESS:   Kara Mccullough, a 44 y.o. female, was seen for a [facility] [place] cancer genetics consultation at the request of Dr. Shelia Media due to a personal and family history of cancer.  Kara Mccullough presents to clinic today to discuss the possibility of a hereditary predisposition to cancer, genetic testing, and to further clarify her future cancer risks, as well as potential cancer risks for family members.   CANCER HISTORY:  Kara Mccullough was diagnosed with right breast DCIS at age 83. Tumor markers are pending. She is s/p excisional biopsy and reports she is planning radiation therapy. She has no prior history of cancer.   Past Medical History  Diagnosis Date   Seasonal allergies    Breast cancer, right breast    Hx of breast implants, bilateral     Rectropectoral   Breast cancer 10/2014    right DCIS    Past Surgical History  Procedure Laterality Date   Tonsillectomy     Breast enhancement surgery  2006    implants   Breast lumpectomy with radioactive seed localization Right 10/24/2014    Procedure: BREAST LUMPECTOMY WITH RADIOACTIVE SEED LOCALIZATION;  Surgeon: Donnie Mesa, MD;  Location: [facility];  Service: General;  Laterality: Right;    History   Social History   Marital Status: Married    Spouse Name: N/A   Number of Children: N/A   Years of Education: N/A   Social History Main Topics   Smoking status: Never Smoker    Smokeless tobacco: Not on file   Alcohol Use: Yes   Drug Use: No   Sexual Activity: Not on file   Other Topics Concern   Not on file   Social History Narrative     FAMILY HISTORY:  During the visit, a 4-generation  pedigree was obtained. A copy of the pedigree with be scanned into Epic under the Media tab. Significant family history diagnoses include the following: Family History  Problem Relation Age of Onset   Thyroid disease Mother    Cancer Mother 81    thyroid- surgery, unkown other cancer   Cancer Paternal Uncle     stomach   Cancer Maternal Grandmother     lung smoker   Other Other     only child; mother and father have no sisters   Cancer Cousin     mat first cousin with possible ovarian cancer   Ms. Wymer's ancestry is of Caucasian descent. There is no known Jewish ancestry or consanguinity.  GENETIC COUNSELING ASSESSMENT:  Kara Mccullough is a 44 y.o. female with a personal and family history of cancer suggestive of a hereditary predisposition to cancer. We, therefore, discussed and recommended the following at today's visit.   DISCUSSION:  We reviewed the characteristics, features and inheritance patterns of hereditary cancer syndromes. We also discussed genetic testing, including the appropriate family members to test, the process of testing, insurance coverage and turn-around-time for results. We discussed the implications of a negative, positive and/or variant of uncertain significant result. We recommended Kara Mccullough pursue genetic testing for the OvaNext gene panel. The OvaNext gene panel offered by [facility] includes sequencing and rearrangement analysis for the following 24 genes:ATM, BARD1, BRCA1,  BRCA2, BRIP1, CDH1, CHEK2, EPCAM, MLH1, MRE11A, MSH2, MSH6, MUTYH, NBN, NF1, PALB2, PMS2, PTEN, RAD50, RAD51C, RAD51D, SMARCA4, STK11, and TP53.  PLAN:  Based on our above recommendation, Kara Mccullough wished to pursue genetic testing and the blood sample was drawn and will be sent to [facility] for analysis. Results should be available within approximately 4 weeks time, at which point they will be disclosed by telephone to [name], as will any additional  recommendations warranted by these results. Lastly, we encouraged Kara Mccullough to remain in contact with cancer genetics annually so that we can continuously update the family history and inform her of any changes in cancer genetics and testing that may be of benefit for this family.   Ms.  Lebon questions were answered to her satisfaction today. Our contact information was provided should additional questions or concerns arise. Thank you for the referral and allowing [place] to share in the care of your Mccullough.   Catherine A. Fine, MS, CGC Certified Psychologist, sport and [email or web address] phone: [phone]  The Mccullough was seen for a total of 45 minutes in face-to-face genetic counseling.  This Mccullough was discussed with Dr. Jana Hakim who agrees with the above.    ______________________________________________________________________ For Office Staff:  Number of people involved in session including genetic counselor: 2 Was an intern or student involved with case: not applicable

## 2014-11-07 NOTE — Telephone Encounter (Signed)
GENETIC APPT-LEFT MESSAGE FOR PATIENT TO RETURN CALL TO SCHEDULE GENETIC APPT

## 2014-11-27 ENCOUNTER — Encounter: Payer: Self-pay | Admitting: Genetic Counselor

## 2014-11-27 ENCOUNTER — Ambulatory Visit
Admission: RE | Admit: 2014-11-27 | Discharge: 2014-11-27 | Disposition: A | Discharge status: Home / Self Care | Payer: BLUE CROSS/BLUE SHIELD | Source: Ambulatory Visit | Attending: Radiation Oncology | Admitting: Radiation Oncology

## 2014-11-27 DIAGNOSIS — D0511 Intraductal carcinoma in situ of right breast: Secondary | ICD-10-CM

## 2014-11-27 DIAGNOSIS — C50919 Malignant neoplasm of unspecified site of unspecified female breast: Secondary | ICD-10-CM

## 2014-11-27 NOTE — Progress Notes (Signed)
Complex simulation/treatment planning note: The patient was taken to the CT simulator.  A Vac lock immobilization device was constructed on a custom breast board.  Her right breast borders were marked with radiopaque wires.  Her partial mastectomy scar was also marked.  She was then scanned.  I chose and isocenter.  The CT data set was sent to the planning system where contoured her tumor bed and also revised her contoured of the nipple and scar.  The remaining contoured as were performed by dosimetry.  She was set up to medial and lateral right breast tangents.  2 sets of unique MLCs were designed to conform the field.  I prescribing 4680 cGy in 26 sessions utilizing 6 MV photons.  This be followed by electron beam boost to cover her posterior tumor bed.  The boost will receive 1400 cGy in 7 sessions 66 MEV electrons with 0.8 cm custom bolus.

## 2014-11-27 NOTE — Progress Notes (Signed)
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 Please refer to our prior cancer genetics clinic note for more information regarding Kara Mccullough's medical, social and family histories, and our assessment and recommendations, at the time. Kara Mccullough genetic test results and recommendations warranted by these results were recently disclosed to her and are discussed in more detail below.  GENETIC TEST RESULTS: At the time of Kara Mccullough's visit, we recommended she pursue genetic testing, which includes sequencing and deletion/duplication analysis of several genes associated with an increased risk for cancer via a gene panel. The OvaNext gene panel offered by [facility] and includes sequencing and rearrangement analysis for the following 24 genes:ATM, BARD1, BRCA1, BRCA2, BRIP1, CDH1, CHEK2, EPCAM, MLH1, MRE11A, MSH2, MSH6, MUTYH, NBN, NF1, PALB2, PMS2, PTEN, RAD50, RAD51C, RAD51D, SMARCA4, STK11, and TP53. Genetic testing for this gene panel was normal and did not reveal a pathogenic mutation in any of these genes. A copy of the genetic test report will be scanned into Epic under the media tab. Genetic testing did identify a variant of uncertain significance which is mentioned above. At this time, it is unknown if this variant is associated with an increased risk for cancer or if this is a normal finding. With time, we suspect the lab will reclassify this variant  and when they do, we will try to re-contact Kara Mccullough to discuss the reclassification further.   We discussed with Kara Mccullough that current genetic testing is not perfect, and it is, therefore, possible there may be a pathogenic gene mutation in one of these genes that current testing cannot detect, but that chance is small.  We also discussed, that it is possible that another gene that has not yet been discovered, or that we have not yet tested, is responsible for the cancer diagnoses in her family. It is, therefore, important for Kara Mccullough to continue to remain in touch with cancer genetics so that we can continue to offer Kara Mccullough the most up to date genetic testing.   CANCER SCREENING RECOMMENDATIONS:  This result is reassuring and indicates that Kara Mccullough likely does not have an increased risk for a future cancer due to a mutation in one of these genes. This normal test also suggests that Kara Mccullough cancer was most likely not due to an inherited predisposition associated with one of these genes.  Most cancers happen by chance and this negative test suggests that her cancer falls into this category.  We, therefore, recommended she continue to follow the cancer management and screening guidelines provided by her oncology and primary healthcare providers.   RECOMMENDATIONS FOR FAMILY MEMBERS:  Individuals in this family might be at some increased risk of developing cancer, over the general population risk, simply due to the family history of cancer.  We recommended women in this family have a yearly mammogram beginning at age 33, an annual clinical breast exam, and perform monthly breast self-exams. Women in this family should also have a gynecological exam as recommended by their primary provider. All family  members should have a colonoscopy by age 58, an annual dermatological exam and continue to follow cancer screening guidelines recommended by their healthcare provider.  FOLLOW-UP: Lastly,  we discussed with Kara Mccullough that cancer genetics is a rapidly advancing field and it is likely that new genetic tests will be appropriate for her and/or family members in the future. We encouraged her to remain in contact with cancer genetics on an annual basis so we can update her personal and family histories and let her know of advances in cancer genetics that may benefit this family.   Our contact number was provided. Kara Mccullough questions were answered to her satisfaction, and she knows she is welcome to call us at anytime with additional questions or concerns.    Kara A. Fine, MS, CGC Certified Psychologist, [email or web address] Phone: [phone]

## 2014-12-01 ENCOUNTER — Encounter: Payer: Self-pay | Admitting: Radiation Oncology

## 2014-12-01 DIAGNOSIS — D0511 Intraductal carcinoma in situ of right breast: Secondary | ICD-10-CM | POA: Diagnosis not present

## 2014-12-01 NOTE — Progress Notes (Signed)
3-D simulation note: The patient completed 3-D simulation for treatment to her right breast.  She was set up to medial and lateral right breast tangents.  2 unique MLCs were designed for each field for a total of 4 complex treatment devices.  I am prescribing 4680 cGy in 26 sessions utilizing 6 MV photons.  Dose volume histograms were obtained for the target structures including the tumor bed and avoidance structures including the lungs and heart.  We met our departmental guidelines.

## 2014-12-04 ENCOUNTER — Ambulatory Visit
Admission: RE | Admit: 2014-12-04 | Discharge: 2014-12-04 | Disposition: A | Discharge status: Home / Self Care | Payer: BLUE CROSS/BLUE SHIELD | Source: Ambulatory Visit | Attending: Radiation Oncology | Admitting: Radiation Oncology

## 2014-12-04 DIAGNOSIS — D0511 Intraductal carcinoma in situ of right breast: Secondary | ICD-10-CM | POA: Diagnosis not present

## 2014-12-08 ENCOUNTER — Encounter: Payer: Self-pay | Admitting: Radiation Oncology

## 2014-12-08 ENCOUNTER — Ambulatory Visit
Admission: RE | Admit: 2014-12-08 | Discharge: 2014-12-08 | Disposition: A | Discharge status: Home / Self Care | Payer: BLUE CROSS/BLUE SHIELD | Source: Ambulatory Visit | Attending: Radiation Oncology | Admitting: Radiation Oncology

## 2014-12-08 VITALS — BP 131/75 | HR 55 | Temp 98.6°F | Ht 64.0 in | Wt 138.5 lb

## 2014-12-08 DIAGNOSIS — D0511 Intraductal carcinoma in situ of right breast: Secondary | ICD-10-CM | POA: Insufficient documentation

## 2014-12-08 MED ORDER — RADIAPLEXRX EX GEL
Freq: Once | CUTANEOUS | Status: AC
  Administered 2014-12-08: 14:00:00 via TOPICAL

## 2014-12-08 MED ORDER — ALRA NON-METALLIC DEODORANT (RAD-ONC)
1.0000 "application " | Freq: Once | TOPICAL | Status: AC
  Administered 2014-12-08: 1 via TOPICAL

## 2014-12-08 NOTE — Addendum Note (Signed)
Encounter addended by: Benn Moulder, RN on: 12/08/2014  4:06 PM<BR>     Documentation filed: Inpatient Patient Education

## 2014-12-08 NOTE — Addendum Note (Signed)
Encounter addended by: Benn Moulder, RN on: 12/08/2014  2:07 PM<BR>     Documentation filed: [employer], Orders

## 2014-12-08 NOTE — Progress Notes (Signed)
Weekly Management Note:  Site: Right breast Current Dose:  180  cGy Projected Dose: 4680 cGy followed by a boost of 1400 cGy in 7 sessions    Narrative: The patient is seen today for routine under treatment assessment. CBCT/MVCT images/port films were reviewed. The chart was reviewed.   She began  radiation therapy today.  She tells me that she will be leaving for a family beach trip the week of June 13, and she would also be out of town for business on June 23 and June 24.  She tells me that her family beach trip is very important to her.  I told her it would not be satisfactory for her to miss an entire week, and she may consider returning for a partial week and perhaps even being treated twice a day to make up for lost time.  We may need to add extra treatments based on her vacation/work schedule.  Missing treatment may not have any impact in her overall survival, but could result in less than ideal local control, particularly review of her deep margin.  Physical Examination:  Filed Vitals:   12/08/14 1249  BP: 131/75  Pulse: 55  Temp: 98.6 F (37 C)  .  Weight: 138 lb 8 oz (62.823 kg).  Not examined today.  Impression: Tolerating radiation therapy well.  Plan: Continue radiation therapy as planned.

## 2014-12-08 NOTE — Progress Notes (Signed)
Pt here for patient teaching and for review by Dr. Valere Dross.  Has received her first treatment to her right breast  No voiced concerns    Pt given Radiation and You booklet, skin care instructions, Alra deodorant and Radiaplex gel. Reviewed areas of pertinence such as fatigue, skin changes, breast tenderness and breast swelling . Pt able to give teach back of to pat skin, use unscented/gentle soap, use baby wipes, have Imodium on hand, drink plenty of water and sitz bath,apply Radiaplex bid, avoid applying anything to skin within 4 hours of treatment, avoid wearing an under wire bra and to use an electric razor if they must shave. Pt demonstrated understanding of information given and will contact nursing with any questions or concerns.

## 2014-12-08 NOTE — Addendum Note (Signed)
Encounter addended by: Benn Moulder, RN on: 12/08/2014  2:14 PM<BR>     Documentation filed: Inpatient MAR

## 2014-12-09 ENCOUNTER — Ambulatory Visit
Admission: RE | Admit: 2014-12-09 | Discharge: 2014-12-09 | Disposition: A | Discharge status: Home / Self Care | Payer: BLUE CROSS/BLUE SHIELD | Source: Ambulatory Visit | Attending: Radiation Oncology | Admitting: Radiation Oncology

## 2014-12-09 DIAGNOSIS — D0511 Intraductal carcinoma in situ of right breast: Secondary | ICD-10-CM | POA: Diagnosis not present

## 2014-12-10 ENCOUNTER — Ambulatory Visit
Admission: RE | Admit: 2014-12-10 | Discharge: 2014-12-10 | Disposition: A | Discharge status: Home / Self Care | Payer: BLUE CROSS/BLUE SHIELD | Source: Ambulatory Visit | Attending: Radiation Oncology | Admitting: Radiation Oncology

## 2014-12-10 DIAGNOSIS — D0511 Intraductal carcinoma in situ of right breast: Secondary | ICD-10-CM | POA: Diagnosis not present

## 2014-12-11 ENCOUNTER — Ambulatory Visit
Admission: RE | Admit: 2014-12-11 | Discharge: 2014-12-11 | Disposition: A | Discharge status: Home / Self Care | Payer: BLUE CROSS/BLUE SHIELD | Source: Ambulatory Visit | Attending: Radiation Oncology | Admitting: Radiation Oncology

## 2014-12-11 DIAGNOSIS — D0511 Intraductal carcinoma in situ of right breast: Secondary | ICD-10-CM | POA: Diagnosis not present

## 2014-12-12 ENCOUNTER — Ambulatory Visit
Admission: RE | Admit: 2014-12-12 | Discharge: 2014-12-12 | Disposition: A | Discharge status: Home / Self Care | Payer: BLUE CROSS/BLUE SHIELD | Source: Ambulatory Visit | Attending: Radiation Oncology | Admitting: Radiation Oncology

## 2014-12-12 DIAGNOSIS — D0511 Intraductal carcinoma in situ of right breast: Secondary | ICD-10-CM | POA: Diagnosis not present

## 2014-12-15 ENCOUNTER — Encounter: Payer: Self-pay | Admitting: Radiation Oncology

## 2014-12-15 ENCOUNTER — Ambulatory Visit
Admission: RE | Admit: 2014-12-15 | Discharge: 2014-12-15 | Disposition: A | Discharge status: Home / Self Care | Payer: BLUE CROSS/BLUE SHIELD | Source: Ambulatory Visit | Attending: Radiation Oncology | Admitting: Radiation Oncology

## 2014-12-15 VITALS — BP 119/76 | HR 61 | Temp 98.5°F | Ht 64.0 in | Wt 136.9 lb

## 2014-12-15 DIAGNOSIS — D0511 Intraductal carcinoma in situ of right breast: Secondary | ICD-10-CM

## 2014-12-15 NOTE — Progress Notes (Signed)
Kara Mccullough has received 6 fractions to her right breast.  No changes noted at this time.

## 2014-12-15 NOTE — Progress Notes (Signed)
Weekly Management Note:  Site: Right breast Current Dose:  1080  cGy Projected Dose: 4680  cGy followed by 7 fraction boost  Narrative: The patient is seen today for routine under treatment assessment. CBCT/MVCT images/port films were reviewed. The chart was reviewed.   She is without complaints today.  She uses Radioplex gel.  We reviewed her schedule, and she will take time off the week of June 13.  She will have 1 treatment on Tuesday, June 14, and 2 treatments (BID) on Wednesday, June 15.  She will also be out Thursday, June 23 and Friday, June 24 for mandatory business.  I will have physics calculate in the need for adding an additional one or 2 fractions of radiation therapy based on her being out these times and also being off for [facility] Day.  Physical Examination:  Filed Vitals:   12/15/14 1505  BP: 119/76  Pulse: 61  Temp: 98.5 F (36.9 C)  .  Weight: 136 lb 14.4 oz (62.097 kg).  No complaints of any skin changes.  Not examined today.  Impression: Tolerating radiation therapy well.  Plan: Continue radiation therapy as planned.

## 2014-12-16 ENCOUNTER — Ambulatory Visit
Admission: RE | Admit: 2014-12-16 | Discharge: 2014-12-16 | Disposition: A | Discharge status: Home / Self Care | Payer: BLUE CROSS/BLUE SHIELD | Source: Ambulatory Visit | Attending: Radiation Oncology | Admitting: Radiation Oncology

## 2014-12-16 DIAGNOSIS — D0511 Intraductal carcinoma in situ of right breast: Secondary | ICD-10-CM | POA: Diagnosis not present

## 2014-12-17 ENCOUNTER — Ambulatory Visit
Admission: RE | Admit: 2014-12-17 | Discharge: 2014-12-17 | Disposition: A | Discharge status: Home / Self Care | Payer: BLUE CROSS/BLUE SHIELD | Source: Ambulatory Visit | Attending: Radiation Oncology | Admitting: Radiation Oncology

## 2014-12-17 DIAGNOSIS — D0511 Intraductal carcinoma in situ of right breast: Secondary | ICD-10-CM | POA: Diagnosis not present

## 2014-12-18 ENCOUNTER — Ambulatory Visit
Admission: RE | Admit: 2014-12-18 | Discharge: 2014-12-18 | Disposition: A | Discharge status: Home / Self Care | Payer: BLUE CROSS/BLUE SHIELD | Source: Ambulatory Visit | Attending: Radiation Oncology | Admitting: Radiation Oncology

## 2014-12-18 DIAGNOSIS — D0511 Intraductal carcinoma in situ of right breast: Secondary | ICD-10-CM | POA: Diagnosis not present

## 2014-12-19 ENCOUNTER — Ambulatory Visit
Admission: RE | Admit: 2014-12-19 | Discharge: 2014-12-19 | Disposition: A | Discharge status: Home / Self Care | Payer: BLUE CROSS/BLUE SHIELD | Source: Ambulatory Visit | Attending: Radiation Oncology | Admitting: Radiation Oncology

## 2014-12-19 DIAGNOSIS — D0511 Intraductal carcinoma in situ of right breast: Secondary | ICD-10-CM | POA: Diagnosis not present

## 2014-12-23 ENCOUNTER — Encounter: Payer: Self-pay | Admitting: Radiation Oncology

## 2014-12-23 ENCOUNTER — Ambulatory Visit
Admission: RE | Admit: 2014-12-23 | Discharge: 2014-12-23 | Disposition: A | Discharge status: Home / Self Care | Payer: BLUE CROSS/BLUE SHIELD | Source: Ambulatory Visit | Attending: Radiation Oncology | Admitting: Radiation Oncology

## 2014-12-23 VITALS — BP 115/81 | HR 64 | Resp 16 | Wt 134.9 lb

## 2014-12-23 DIAGNOSIS — D0511 Intraductal carcinoma in situ of right breast: Secondary | ICD-10-CM | POA: Diagnosis not present

## 2014-12-23 NOTE — Progress Notes (Signed)
Weight and vitals stable. Denies pain. No skin changes to right/treated breast noted. Reports using radiaplex bid as directed. Denies fatigue.

## 2014-12-23 NOTE — Progress Notes (Signed)
Weekly Management Note:  Site: Right breast Current Dose:  1980  cGy Projected Dose: 0383  CGy followed by boost  Narrative: The patient is seen today for routine under treatment assessment. CBCT/MVCT images/port films were reviewed. The chart was reviewed.   She is without complaints today.  She uses Radioplex gel.  Physical Examination:  Filed Vitals:   12/23/14 0952  BP: 115/81  Pulse: 64  Resp: 16  .  Weight: 134 lb 14.4 oz (61.19 kg).  There is mild hyperpigmentation of the skin with no areas of desquamation.  Impression: Tolerating radiation therapy well.  We will be adding one extra treatment to make up for her scheduled time off.  Plan: Continue radiation therapy as planned.

## 2014-12-24 ENCOUNTER — Ambulatory Visit
Admission: RE | Admit: 2014-12-24 | Discharge: 2014-12-24 | Disposition: A | Discharge status: Home / Self Care | Payer: BLUE CROSS/BLUE SHIELD | Source: Ambulatory Visit | Attending: Radiation Oncology | Admitting: Radiation Oncology

## 2014-12-24 DIAGNOSIS — D0511 Intraductal carcinoma in situ of right breast: Secondary | ICD-10-CM | POA: Diagnosis not present

## 2014-12-25 ENCOUNTER — Ambulatory Visit
Admission: RE | Admit: 2014-12-25 | Discharge: 2014-12-25 | Disposition: A | Discharge status: Home / Self Care | Payer: BLUE CROSS/BLUE SHIELD | Source: Ambulatory Visit | Attending: Radiation Oncology | Admitting: Radiation Oncology

## 2014-12-25 DIAGNOSIS — D0511 Intraductal carcinoma in situ of right breast: Secondary | ICD-10-CM | POA: Diagnosis not present

## 2014-12-26 ENCOUNTER — Ambulatory Visit
Admission: RE | Admit: 2014-12-26 | Discharge: 2014-12-26 | Disposition: A | Discharge status: Home / Self Care | Payer: BLUE CROSS/BLUE SHIELD | Source: Ambulatory Visit | Attending: Radiation Oncology | Admitting: Radiation Oncology

## 2014-12-26 DIAGNOSIS — D0511 Intraductal carcinoma in situ of right breast: Secondary | ICD-10-CM | POA: Diagnosis not present

## 2014-12-29 ENCOUNTER — Encounter: Payer: Self-pay | Admitting: Radiation Oncology

## 2014-12-29 ENCOUNTER — Ambulatory Visit
Admission: RE | Admit: 2014-12-29 | Discharge: 2014-12-29 | Disposition: A | Discharge status: Home / Self Care | Payer: BLUE CROSS/BLUE SHIELD | Source: Ambulatory Visit | Attending: Radiation Oncology | Admitting: Radiation Oncology

## 2014-12-29 VITALS — BP 117/73 | HR 64 | Temp 98.2°F | Resp 16 | Wt 132.6 lb

## 2014-12-29 DIAGNOSIS — D0511 Intraductal carcinoma in situ of right breast: Secondary | ICD-10-CM | POA: Diagnosis not present

## 2014-12-29 NOTE — Progress Notes (Signed)
Weekly Management Note:  Site: Right breast Current Dose:  2700  cGy Projected Dose: 4680  cGy followed by right breast boost  Narrative: The patient is seen today for routine under treatment assessment. CBCT/MVCT images/port films were reviewed. The chart was reviewed.   She is without complaints today.  She uses Radioplex gel.  Physical Examination:  Filed Vitals:   12/29/14 0914  BP: 117/73  Pulse: 64  Temp: 98.2 F (36.8 C)  Resp: 16  .  Weight: 132 lb 9.6 oz (60.147 kg).  There is mild hyperpigmentation the skin along the right breast with erythema along the inframammary region.  No areas of desquamation.  Impression: Tolerating radiation therapy well.  Plan: Continue radiation therapy as planned.

## 2014-12-29 NOTE — Progress Notes (Signed)
Weekly rad txs right breast 15/34 completed, slight pink on skin, intact, using radiaplex, patient asked for hydrogel pads ,going to the beach for 2 weeks, gave and instructions of use of gel pads BP 117/73 mmHg  Pulse 64  Temp(Src) 98.2 F (36.8 C) (Oral)  Resp 16  Wt 132 lb 9.6 oz (60.147 kg) Wt Readings from Last 3 Encounters:  11/06/14 138 lb 9.6 oz (62.869 kg)  10/24/14 138 lb 2 oz (62.653 kg)   9:16 AM'

## 2014-12-30 ENCOUNTER — Ambulatory Visit
Admission: RE | Admit: 2014-12-30 | Discharge: 2014-12-30 | Disposition: A | Discharge status: Home / Self Care | Payer: BLUE CROSS/BLUE SHIELD | Source: Ambulatory Visit | Attending: Radiation Oncology | Admitting: Radiation Oncology

## 2014-12-30 DIAGNOSIS — D0511 Intraductal carcinoma in situ of right breast: Secondary | ICD-10-CM | POA: Diagnosis not present

## 2014-12-31 ENCOUNTER — Ambulatory Visit
Admission: RE | Admit: 2014-12-31 | Discharge: 2014-12-31 | Disposition: A | Discharge status: Home / Self Care | Payer: BLUE CROSS/BLUE SHIELD | Source: Ambulatory Visit | Attending: Radiation Oncology | Admitting: Radiation Oncology

## 2014-12-31 ENCOUNTER — Encounter: Payer: Self-pay | Admitting: Radiation Oncology

## 2014-12-31 DIAGNOSIS — D0511 Intraductal carcinoma in situ of right breast: Secondary | ICD-10-CM | POA: Diagnosis not present

## 2014-12-31 NOTE — Progress Notes (Signed)
Weekly Management Note:  Site: Right breast Current Dose:  3060  cGy Projected Dose: 6280  cGy  Narrative: The patient is seen today for routine under treatment assessment. CBCT/MVCT images/port films were reviewed. The chart was reviewed.   She is without new complaints today.  She does have slight pruritus along the upper inner quadrant of the right breast.  She uses Radioplex gel.  Physical Examination: There were no vitals filed for this visit..  Weight:  .  There is mild hyperpigmentation the skin along the right breast with a mild papular erythema along the upper inner quadrant.  No areas of desquamation.  Impression: Tolerating radiation therapy well.  I instructed her to avoid the sun which she is the beach next week.  Plan: Continue radiation therapy as planned.

## 2015-01-01 ENCOUNTER — Ambulatory Visit
Admission: RE | Admit: 2015-01-01 | Discharge: 2015-01-01 | Disposition: A | Discharge status: Home / Self Care | Payer: BLUE CROSS/BLUE SHIELD | Source: Ambulatory Visit | Attending: Radiation Oncology | Admitting: Radiation Oncology

## 2015-01-01 DIAGNOSIS — D0511 Intraductal carcinoma in situ of right breast: Secondary | ICD-10-CM | POA: Diagnosis not present

## 2015-01-02 ENCOUNTER — Ambulatory Visit
Admission: RE | Admit: 2015-01-02 | Discharge: 2015-01-02 | Disposition: A | Discharge status: Home / Self Care | Payer: BLUE CROSS/BLUE SHIELD | Source: Ambulatory Visit | Attending: Radiation Oncology | Admitting: Radiation Oncology

## 2015-01-02 DIAGNOSIS — D0511 Intraductal carcinoma in situ of right breast: Secondary | ICD-10-CM | POA: Diagnosis not present

## 2015-01-05 ENCOUNTER — Ambulatory Visit: Payer: BLUE CROSS/BLUE SHIELD

## 2015-01-05 ENCOUNTER — Ambulatory Visit
Admission: RE | Admit: 2015-01-05 | Discharge: 2015-01-05 | Disposition: A | Discharge status: Home / Self Care | Payer: BLUE CROSS/BLUE SHIELD | Source: Ambulatory Visit | Attending: Radiation Oncology | Admitting: Radiation Oncology

## 2015-01-06 ENCOUNTER — Ambulatory Visit: Payer: BLUE CROSS/BLUE SHIELD

## 2015-01-06 ENCOUNTER — Ambulatory Visit
Admission: RE | Admit: 2015-01-06 | Discharge: 2015-01-06 | Disposition: A | Discharge status: Home / Self Care | Payer: BLUE CROSS/BLUE SHIELD | Source: Ambulatory Visit | Attending: Radiation Oncology | Admitting: Radiation Oncology

## 2015-01-06 DIAGNOSIS — D0511 Intraductal carcinoma in situ of right breast: Secondary | ICD-10-CM | POA: Diagnosis not present

## 2015-01-07 ENCOUNTER — Ambulatory Visit: Payer: BLUE CROSS/BLUE SHIELD

## 2015-01-07 ENCOUNTER — Ambulatory Visit
Admission: RE | Admit: 2015-01-07 | Discharge: 2015-01-07 | Disposition: A | Discharge status: Home / Self Care | Payer: BLUE CROSS/BLUE SHIELD | Source: Ambulatory Visit | Attending: Radiation Oncology | Admitting: Radiation Oncology

## 2015-01-07 DIAGNOSIS — D0511 Intraductal carcinoma in situ of right breast: Secondary | ICD-10-CM | POA: Diagnosis not present

## 2015-01-08 ENCOUNTER — Ambulatory Visit: Payer: BLUE CROSS/BLUE SHIELD

## 2015-01-08 ENCOUNTER — Ambulatory Visit: Admission: RE | Admit: 2015-01-08 | Payer: BLUE CROSS/BLUE SHIELD | Source: Ambulatory Visit

## 2015-01-09 ENCOUNTER — Ambulatory Visit: Admission: RE | Admit: 2015-01-09 | Payer: BLUE CROSS/BLUE SHIELD | Source: Ambulatory Visit

## 2015-01-09 ENCOUNTER — Ambulatory Visit: Payer: BLUE CROSS/BLUE SHIELD

## 2015-01-12 ENCOUNTER — Ambulatory Visit
Admission: RE | Admit: 2015-01-12 | Discharge: 2015-01-12 | Disposition: A | Discharge status: Home / Self Care | Payer: BLUE CROSS/BLUE SHIELD | Source: Ambulatory Visit | Attending: Radiation Oncology | Admitting: Radiation Oncology

## 2015-01-12 ENCOUNTER — Encounter: Payer: Self-pay | Admitting: Radiation Oncology

## 2015-01-12 VITALS — BP 120/73 | HR 54 | Temp 98.4°F | Ht 64.0 in | Wt 134.4 lb

## 2015-01-12 DIAGNOSIS — D0511 Intraductal carcinoma in situ of right breast: Secondary | ICD-10-CM

## 2015-01-12 NOTE — Progress Notes (Signed)
Weekly Management Note:  Site: Right breast Current Dose:  4140  cGy Projected Dose: 4680  cGy followed by 8 fraction boost  Narrative: The patient is seen today for routine under treatment assessment. CBCT/MVCT images/port films were reviewed. The chart was reviewed.   She is without complaints today.  She is back from the beach.   She did come in last week for 3 fractions of radiation therapy.  She uses Radioplex gel.  I checked her electron beam setup today.  Physical Examination: There were no vitals filed for this visit..  Weight:  .  There is mild erythema/hyperpigmentation the skin along the right breast.  No areas of desquamation.  Impression: Tolerating radiation therapy well.  Plan: Continue radiation therapy as planned.

## 2015-01-12 NOTE — Progress Notes (Signed)
Kara Mccullough has received 23 fractions to her right breast.  Note hyperpigmentation with mild dryness and mild rash in the inframmary fold.  Denies fatigue

## 2015-01-13 ENCOUNTER — Ambulatory Visit
Admission: RE | Admit: 2015-01-13 | Discharge: 2015-01-13 | Disposition: A | Discharge status: Home / Self Care | Payer: BLUE CROSS/BLUE SHIELD | Source: Ambulatory Visit | Attending: Radiation Oncology | Admitting: Radiation Oncology

## 2015-01-13 DIAGNOSIS — D0511 Intraductal carcinoma in situ of right breast: Secondary | ICD-10-CM | POA: Diagnosis not present

## 2015-01-14 ENCOUNTER — Ambulatory Visit: Payer: BLUE CROSS/BLUE SHIELD

## 2015-01-14 ENCOUNTER — Ambulatory Visit
Admission: RE | Admit: 2015-01-14 | Discharge: 2015-01-14 | Disposition: A | Discharge status: Home / Self Care | Payer: BLUE CROSS/BLUE SHIELD | Source: Ambulatory Visit | Attending: Radiation Oncology | Admitting: Radiation Oncology

## 2015-01-14 DIAGNOSIS — D0511 Intraductal carcinoma in situ of right breast: Secondary | ICD-10-CM | POA: Diagnosis not present

## 2015-01-15 ENCOUNTER — Ambulatory Visit: Payer: BLUE CROSS/BLUE SHIELD

## 2015-01-15 ENCOUNTER — Ambulatory Visit: Admission: RE | Admit: 2015-01-15 | Payer: BLUE CROSS/BLUE SHIELD | Source: Ambulatory Visit

## 2015-01-16 ENCOUNTER — Ambulatory Visit: Payer: BLUE CROSS/BLUE SHIELD

## 2015-01-16 ENCOUNTER — Ambulatory Visit: Admission: RE | Admit: 2015-01-16 | Payer: BLUE CROSS/BLUE SHIELD | Source: Ambulatory Visit

## 2015-01-19 ENCOUNTER — Ambulatory Visit: Payer: BLUE CROSS/BLUE SHIELD

## 2015-01-19 ENCOUNTER — Ambulatory Visit
Admission: RE | Admit: 2015-01-19 | Discharge: 2015-01-19 | Disposition: A | Discharge status: Home / Self Care | Payer: BLUE CROSS/BLUE SHIELD | Source: Ambulatory Visit | Attending: Radiation Oncology | Admitting: Radiation Oncology

## 2015-01-19 ENCOUNTER — Encounter: Payer: Self-pay | Admitting: Radiation Oncology

## 2015-01-19 VITALS — BP 118/76 | HR 54 | Temp 98.5°F | Ht 64.0 in | Wt 136.3 lb

## 2015-01-19 DIAGNOSIS — D0511 Intraductal carcinoma in situ of right breast: Secondary | ICD-10-CM

## 2015-01-19 NOTE — Progress Notes (Addendum)
Ms. Kurkowski has received 26 fractions to her right breast.  Note erythema with hyperpigmentation of her breast. Skin intact.  Reports occasional itching of skin in tx field.  Continues using Radiaplex

## 2015-01-19 NOTE — Progress Notes (Signed)
Weekly Management Note:  Site: Right breast Current Dose:  4680  cGy Projected Dose: 4680  cGy followed by boost of 1600 cGy in 8 sessions  Narrative: The patient is seen today for routine under treatment assessment. CBCT/MVCT images/port films were reviewed. The chart was reviewed.   She is without new complaints today.  She has occasional pruritus.  She continues with Radioplex gel.  Physical Examination:  Filed Vitals:   01/19/15 0922  BP: 118/76  Pulse: 54  Temp: 98.5 F (36.9 C)  .  Weight: 136 lb 4.8 oz (61.825 kg).  There is hyperpigmentation of the skin along the right breast with moderate erythema as well.  No areas of desquamation.  Impression: Tolerating radiation therapy well.  Plan: Continue radiation therapy as planned.

## 2015-01-20 ENCOUNTER — Ambulatory Visit: Payer: BLUE CROSS/BLUE SHIELD

## 2015-01-20 ENCOUNTER — Ambulatory Visit
Admission: RE | Admit: 2015-01-20 | Discharge: 2015-01-20 | Disposition: A | Discharge status: Home / Self Care | Payer: BLUE CROSS/BLUE SHIELD | Source: Ambulatory Visit | Attending: Radiation Oncology | Admitting: Radiation Oncology

## 2015-01-20 DIAGNOSIS — D0511 Intraductal carcinoma in situ of right breast: Secondary | ICD-10-CM | POA: Diagnosis not present

## 2015-01-21 ENCOUNTER — Ambulatory Visit
Admission: RE | Admit: 2015-01-21 | Discharge: 2015-01-21 | Disposition: A | Discharge status: Home / Self Care | Payer: BLUE CROSS/BLUE SHIELD | Source: Ambulatory Visit | Attending: Radiation Oncology | Admitting: Radiation Oncology

## 2015-01-21 DIAGNOSIS — D0511 Intraductal carcinoma in situ of right breast: Secondary | ICD-10-CM | POA: Diagnosis not present

## 2015-01-22 ENCOUNTER — Ambulatory Visit
Admission: RE | Admit: 2015-01-22 | Discharge: 2015-01-22 | Disposition: A | Discharge status: Home / Self Care | Payer: BLUE CROSS/BLUE SHIELD | Source: Ambulatory Visit | Attending: Radiation Oncology | Admitting: Radiation Oncology

## 2015-01-22 ENCOUNTER — Ambulatory Visit: Payer: BLUE CROSS/BLUE SHIELD

## 2015-01-22 DIAGNOSIS — D0511 Intraductal carcinoma in situ of right breast: Secondary | ICD-10-CM | POA: Diagnosis not present

## 2015-01-23 ENCOUNTER — Ambulatory Visit
Admission: RE | Admit: 2015-01-23 | Discharge: 2015-01-23 | Disposition: A | Discharge status: Home / Self Care | Payer: BLUE CROSS/BLUE SHIELD | Source: Ambulatory Visit | Attending: Radiation Oncology | Admitting: Radiation Oncology

## 2015-01-23 ENCOUNTER — Ambulatory Visit: Payer: BLUE CROSS/BLUE SHIELD

## 2015-01-23 DIAGNOSIS — D0511 Intraductal carcinoma in situ of right breast: Secondary | ICD-10-CM | POA: Diagnosis not present

## 2015-01-27 ENCOUNTER — Encounter: Payer: Self-pay | Admitting: Radiation Oncology

## 2015-01-27 ENCOUNTER — Ambulatory Visit
Admission: RE | Admit: 2015-01-27 | Discharge: 2015-01-27 | Disposition: A | Discharge status: Home / Self Care | Payer: BLUE CROSS/BLUE SHIELD | Source: Ambulatory Visit | Attending: Radiation Oncology | Admitting: Radiation Oncology

## 2015-01-27 VITALS — BP 112/78 | HR 58 | Temp 98.3°F | Resp 20 | Wt 135.5 lb

## 2015-01-27 DIAGNOSIS — D0511 Intraductal carcinoma in situ of right breast: Secondary | ICD-10-CM

## 2015-01-27 NOTE — Progress Notes (Signed)
Weekly Management Note:  Site: Right breast Current Dose:  5680  cGy Projected Dose: 6280  cGy  Narrative: The patient is seen today for routine under treatment assessment. CBCT/MVCT images/port films were reviewed. The chart was reviewed.   She is without new complaints today.  She uses Radioplex gel.  She does have mild pruritus as expected.  Physical Examination:  Filed Vitals:   01/27/15 0925  BP: 112/78  Pulse: 58  Temp: 98.3 F (36.8 C)  Resp: 20  .  Weight: 135 lb 8 oz (61.462 kg).  There is slight erythema with marked hyperpigmentation the skin along the right breast along with patchy diffuse dry desquamation.  No areas of moist desquamation.  Impression: Tolerating radiation therapy well.  She will finish her radiation therapy this Friday.  Plan: Continue radiation therapy as planned.  One-month follow-up visit after completion of radiation therapy.

## 2015-01-27 NOTE — Progress Notes (Signed)
Weekly rad txs right breast 31/34 compleetd, mild erythema, occasional itching, no pain, good appetite, radiaplex bid, gave 1 month appt card  9:30 AM BP 112/78 mmHg  Pulse 58  Temp(Src) 98.3 F (36.8 C) (Oral)  Resp 20  Wt 135 lb 8 oz (61.462 kg)  Wt Readings from Last 3 Encounters:  01/27/15 135 lb 8 oz (61.462 kg)  01/19/15 136 lb 4.8 oz (61.825 kg)  01/12/15 134 lb 6.4 oz (60.963 kg)

## 2015-01-28 ENCOUNTER — Ambulatory Visit: Payer: BLUE CROSS/BLUE SHIELD

## 2015-01-28 ENCOUNTER — Ambulatory Visit
Admission: RE | Admit: 2015-01-28 | Discharge: 2015-01-28 | Disposition: A | Discharge status: Home / Self Care | Payer: BLUE CROSS/BLUE SHIELD | Source: Ambulatory Visit | Attending: Radiation Oncology | Admitting: Radiation Oncology

## 2015-01-28 DIAGNOSIS — D0511 Intraductal carcinoma in situ of right breast: Secondary | ICD-10-CM | POA: Diagnosis not present

## 2015-01-29 ENCOUNTER — Ambulatory Visit
Admission: RE | Admit: 2015-01-29 | Discharge: 2015-01-29 | Disposition: A | Discharge status: Home / Self Care | Payer: BLUE CROSS/BLUE SHIELD | Source: Ambulatory Visit | Attending: Radiation Oncology | Admitting: Radiation Oncology

## 2015-01-29 ENCOUNTER — Ambulatory Visit: Payer: BLUE CROSS/BLUE SHIELD

## 2015-01-29 DIAGNOSIS — D0511 Intraductal carcinoma in situ of right breast: Secondary | ICD-10-CM | POA: Diagnosis not present

## 2015-01-30 ENCOUNTER — Ambulatory Visit: Payer: BLUE CROSS/BLUE SHIELD

## 2015-01-30 ENCOUNTER — Ambulatory Visit
Admission: RE | Admit: 2015-01-30 | Discharge: 2015-01-30 | Disposition: A | Discharge status: Home / Self Care | Payer: BLUE CROSS/BLUE SHIELD | Source: Ambulatory Visit | Attending: Radiation Oncology | Admitting: Radiation Oncology

## 2015-01-30 DIAGNOSIS — D0511 Intraductal carcinoma in situ of right breast: Secondary | ICD-10-CM | POA: Diagnosis not present

## 2015-02-02 ENCOUNTER — Ambulatory Visit: Payer: BLUE CROSS/BLUE SHIELD

## 2015-02-03 ENCOUNTER — Ambulatory Visit: Payer: BLUE CROSS/BLUE SHIELD

## 2015-02-07 ENCOUNTER — Encounter: Payer: Self-pay | Admitting: Radiation Oncology

## 2015-02-07 NOTE — Progress Notes (Signed)
[facility] End of Treatment Note  Name:Ajanae B Voshell  Date: 02/07/2015 HKV:[id number] DOB:Jul 11, 1971   Status:outpatient    CC: Horatio Pel, MD  Dr. Donnie Mesa  REFERRING PHYSICIAN:  Dr. 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  6 MEV electrons, right breast boost.                 NARRATIVE:    Ms. Bostic tolerated her treatment recently well with the expected degree of hyperpigmentation, erythema and dry desquamation of the skin.  She had no areas of moist desquamation during her course of therapy.  She had prolonged scheduled absences for a family vacation and also work/travel such that we performed a "decay calculation" recommending an additional treatment which was added to her boost.                        PLAN: Routine followup in one month. Patient instructed to call if questions or worsening complaints in interim.

## 2015-02-07 NOTE — Progress Notes (Signed)
Electron beam simulation note: The patient underwent virtual simulation on June 7 for her right breast electron beam boost.  She was set up en face.  A Monte Carlo calculation/isodose plan was performed and carefully reviewed..  One custom block was constructed to conform the field.  I prescribed 1600 cGy in 8  sessions utilizing 6 MEV electrons.  0.8 cm custom bolus was constructed and applied to her skin on the first of her treatment.

## 2015-03-04 ENCOUNTER — Encounter: Payer: Self-pay | Admitting: Radiation Oncology

## 2015-03-10 ENCOUNTER — Ambulatory Visit
Admission: RE | Admit: 2015-03-10 | Discharge: 2015-03-10 | Disposition: A | Discharge status: Home / Self Care | Payer: BLUE CROSS/BLUE SHIELD | Source: Ambulatory Visit | Attending: Radiation Oncology | Admitting: Radiation Oncology

## 2015-03-10 ENCOUNTER — Encounter: Payer: Self-pay | Admitting: Radiation Oncology

## 2015-03-10 VITALS — BP 122/79 | HR 66 | Temp 98.4°F | Resp 16 | Ht 64.0 in | Wt 135.6 lb

## 2015-03-10 DIAGNOSIS — D0511 Intraductal carcinoma in situ of right breast: Secondary | ICD-10-CM

## 2015-03-10 HISTORY — DX: Personal history of irradiation: Z92.3

## 2015-03-10 NOTE — Progress Notes (Signed)
Follow up right breast radiation txs 12/08/14-01/30/15, breast well healed, slight tanning mid center breast, no pain, appetite good, no fatigue,no mammogram has n=been scheduled as yet  11:49 AM BP 122/79 mmHg  Pulse 66  Temp(Src) 98.4 F (36.9 C) (Oral)  Resp 16  Ht 5\' 4"  (1.626 m)  Wt 135 lb 9.6 oz (61.508 kg)  BMI 23.26 kg/m2  Wt Readings from Last 3 Encounters:  03/10/15 135 lb 9.6 oz (61.508 kg)  01/27/15 135 lb 8 oz (61.462 kg)  01/19/15 136 lb 4.8 oz (61.825 kg)

## 2015-03-10 NOTE — Progress Notes (Signed)
CC: Dr. Deland Pretty, Dr. Donnie Mesa  Follow-up note:  Kara Mccullough returns today approximately 5 weeks following completion of radiation therapy following conservative surgery in the management of her high-grade DCIS of the right breast.  She is without complaints today.  She is pleased with her cosmesis.  She tells me she will see Dr. Georgette Dover for a follow-up visit this fall.  It is recalled that her DCIS was ER weakly positive at 7% and PR negative.  She did not see medical oncology for consultation.  Of note is that she did have a preradiation therapy mammogram this past April.  Physical examination: Alert and oriented. Filed Vitals:   03/10/15 1147  BP: 122/79  Pulse: 66  Temp: 98.4 F (36.9 C)  Resp: 16   Head and neck examination: Grossly unremarkable.  Nodes: Without palpable cervical, supraclavicular, or axillary lymphadenopathy.  Breasts: Bilateral implants.  There is residual hyperpigmentation the skin along the right breast.  There is a 3 millimeter area of raised induration of the right breast periareolar scar at approximately 2:00 which is probably a suture granuloma.  Cosmesis is excellent and there is no significant fibrosis of the right breast.  Extremities: Without edema.  Impression: Satisfactory progress.  Since her presenting mammogram was in March she can probably wait until early next year for follow-up mammography.  This can be scheduled by Dr. Georgette Dover who will see her this fall.  I think it would be worthwhile to have her see medical oncology for discussion of antiestrogen therapy primarily from a "chemo-preventative" standpoint to decrease the risk for a new cancer in either breast.  Lastly,  she has not seen a genetics counselor, and I defer to medical oncology as to whether not this would be appropriate.  Plan: As above.  I will see her back for one more follow-up visit this December since she will be followed long-term by Dr. Georgette Dover , and perhaps medical oncology.

## 2015-03-12 ENCOUNTER — Telehealth: Payer: Self-pay | Admitting: *Deleted

## 2015-03-12 NOTE — Telephone Encounter (Signed)
Received referral from [facility].  Called pt and confirmed 03/23/15 appt w/ her.  Mailed before appt letter, calendar, welcoming packet & intake form to pt.  Emailed Shavon and Ammie at [facility] to make them aware.  All records are in [facility].

## 2015-03-23 ENCOUNTER — Ambulatory Visit (HOSPITAL_BASED_OUTPATIENT_CLINIC_OR_DEPARTMENT_OTHER): Payer: BLUE CROSS/BLUE SHIELD | Admitting: Hematology and Oncology

## 2015-03-23 ENCOUNTER — Encounter: Payer: Self-pay | Admitting: Hematology and Oncology

## 2015-03-23 ENCOUNTER — Ambulatory Visit: Payer: BLUE CROSS/BLUE SHIELD

## 2015-03-23 VITALS — BP 128/72 | HR 52 | Temp 98.7°F | Resp 18 | Ht 64.0 in | Wt 134.0 lb

## 2015-03-23 DIAGNOSIS — Z801 Family history of malignant neoplasm of trachea, bronchus and lung: Secondary | ICD-10-CM | POA: Diagnosis not present

## 2015-03-23 DIAGNOSIS — Z808 Family history of malignant neoplasm of other organs or systems: Secondary | ICD-10-CM

## 2015-03-23 DIAGNOSIS — D0511 Intraductal carcinoma in situ of right breast: Secondary | ICD-10-CM

## 2015-03-23 DIAGNOSIS — Z800 Family history of malignant neoplasm of digestive organs: Secondary | ICD-10-CM | POA: Diagnosis not present

## 2015-03-23 NOTE — Assessment & Plan Note (Signed)
Right breast high-grade DCIS Tis N0 M0 stage 0 status post right lumpectomy for 1 2016: DCIS with calcifications high-grade 1.8 cm broadly present at the posterior margin which is a fascia, ER 7%, PR 0% status post radiation therapy 12/08/2014 to 01/30/2015  Pathology and radiology review: I discussed the details of pathology report and provided a copy of it to the patient. I described the difference between DCIS and invasive breast cancer significance of ER and PR receptors. Given the low degree positive estrogen receptors, the response to benefit from anti-estrogen therapy tend to be lesser.  Recommendation: 1. Adjuvant antiestrogen therapy with tamoxifen 20 mg daily for 5 years 2. Tamoxifen counseling: We discussed the risks and benefits of tamoxifen. These include but not limited to insomnia, hot flashes, mood changes, vaginal dryness, and weight gain. Although rare, serious side effects including endometrial cancer, risk of blood clots were also discussed. We strongly believe that the benefits far outweigh the risks. Patient understands these risks and consented to starting treatment. Planned treatment duration is 5 years.  Return to clinic in 3 months to evaluate toxicities to tamoxifen

## 2015-03-23 NOTE — Addendum Note (Signed)
Addended by: Renford Dills on: 03/23/2015 01:42 PM   Modules accepted: Orders, Medications

## 2015-03-23 NOTE — Progress Notes (Signed)
Checked in new pt with no financial concerns prior to seeing the dr.  Pt has my card for any billing questions, concerns or if financial assistance is needed.  ° °

## 2015-03-23 NOTE — Progress Notes (Signed)
[facility] CONSULT NOTE  Patient Care Team: Deland Pretty, MD as PCP - General (Internal Medicine)  CHIEF COMPLAINTS/PURPOSE OF CONSULTATION:  Newly diagnosed breast cancer  HISTORY OF PRESENTING ILLNESS:  Kara Mccullough 44 y.o. 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 She was seen by Dr. Prince Solian when she was found to have right breast suspicious calcifications. She had prior retropectoral implants and additional views of the breast revealed calcifications spanning 1.1 cm a few additional less suspicious calcifications spanning a total distance of 3.8 cm. These could not be biopsied by stereotactic biopsy because of the location. She underwent an excisional biopsy/lumpectomy by Dr. Prince Solian on 10/24/2014 which revealed high-grade DCIS involving the posterior margin. Unfortunately the posterior margin is the pectoralis fascia and hence no additional surgery was performed. She was seen by Dr. Selinda Eon with radiation oncology and she underwent radiation therapy that was completed 01/30/2015. She has been sent to [place] for discussion regarding further adjuvant antiestrogen therapy options.  I reviewed her records extensively and collaborated the history with the patient.  SUMMARY OF ONCOLOGIC HISTORY:   Neoplasm of right breast, primary tumor staging category Tis: ductal carcinoma in situ (DCIS)   10/07/2014 Mammogram Right breast dominant calcifications 1.1 cm at approximately 1:00 position, less numerous calcifications in the immediate retroareolar right breast total measuring 3.8 cm   10/24/2014 Initial Diagnosis Excisional biopsy was performed because stereotactic biopsy was not feasible due to the location: High-grade DCIS with calcifications spanning 1.8 cm broadly present at posterior margin (pectoralis fascia)   11/27/2014 Procedure Genetic testing was normal for extended panel of genes   12/08/2014 - 01/30/2015 Radiation Therapy Adjuvant radiation therapy    MEDICAL HISTORY:  Past Medical History  Diagnosis Date  . Seasonal allergies   . Breast cancer, right breast   . Hx of breast implants, bilateral     Rectropectoral  . Breast cancer 10/2014    right DCIS  . S/P radiation therapy 12/08/2014 through 01/30/2015     Right breast 4680 cGy and 26 sessions, right breast tumor bed boost 1600 cGy in 8 sessions     SURGICAL HISTORY: Past Surgical History  Procedure Laterality Date  . Tonsillectomy    . Breast enhancement surgery  2006    implants  . Breast lumpectomy with radioactive seed localization Right 10/24/2014    Procedure: BREAST LUMPECTOMY WITH RADIOACTIVE SEED LOCALIZATION;  Surgeon: Donnie Mesa, MD;  Location: [facility];  Service: General;  Laterality: Right;    SOCIAL HISTORY: Social History   Social History  . Marital Status: Married    Spouse Name: N/A  . Number of Children: N/A  . Years of Education: N/A   Occupational History  . Not on file.   Social History Main Topics  . Smoking status: Never Smoker   . Smokeless tobacco: Not on file  . Alcohol Use: Yes  . Drug Use: No  . Sexual Activity: Not on file   Other Topics Concern  . Not on file   Social History Narrative    FAMILY HISTORY: Family History  Problem Relation Age of Onset  . Thyroid disease Mother   . Cancer Mother 57    thyroid- surgery, unkown other cancer  . Cancer Paternal Uncle     stomach  . Cancer Maternal Grandmother     lung smoker  . Other Other     only child; mother and father have no sisters  . Cancer  Cousin     mat first cousin with possible ovarian cancer    ALLERGIES:  has No Known Allergies.  MEDICATIONS:  Current Outpatient Prescriptions  Medication Sig Dispense Refill  . Biotin 10 MG CAPS Take 10 mg by mouth daily.     . hyaluronate sodium (RADIAPLEXRX) GEL Apply 1 application topically 2 (two) times daily.    Marland Kitchen  levonorgestrel-ethinyl estradiol (SEASONALE,INTROVALE,JOLESSA) 0.15-0.03 MG tablet TAKE 1 TABLET BY MOUTH ONCE A DAY AS DIRECTED  4  . Multiple Vitamin (MULTIVITAMIN) tablet Take 1 tablet by mouth daily.    . non-metallic deodorant Jethro Poling) MISC Apply 1 application topically daily as needed.     No current facility-administered medications for this visit.    REVIEW OF SYSTEMS:   Constitutional: Denies fevers, chills or abnormal night sweats Eyes: Denies blurriness of vision, double vision or watery eyes Ears, nose, mouth, throat, and face: Denies mucositis or sore throat Respiratory: Denies cough, dyspnea or wheezes Cardiovascular: Denies palpitation, chest discomfort or lower extremity swelling Gastrointestinal:  Denies nausea, heartburn or change in bowel habits Skin: Denies abnormal skin rashes Lymphatics: Denies new lymphadenopathy or easy bruising Neurological:Denies numbness, tingling or new weaknesses Behavioral/Psych: Mood is stable, no new changes  Breast: Recovered very well from surgery and radiation All other systems were reviewed with the patient and are negative.  PHYSICAL EXAMINATION: ECOG PERFORMANCE STATUS: 1 - Symptomatic but completely ambulatory  Filed Vitals:   03/23/15 1303  BP: 128/72  Pulse: 52  Temp: 98.7 F (37.1 C)  Resp: 18   Filed Weights   03/23/15 1303  Weight: 134 lb (60.782 kg)    GENERAL:alert, no distress and comfortable SKIN: skin color, texture, turgor are normal, no rashes or significant lesions EYES: normal, conjunctiva are pink and non-injected, sclera clear OROPHARYNX:no exudate, no erythema and lips, buccal mucosa, and tongue normal  NECK: supple, thyroid normal size, non-tender, without nodularity LYMPH:  no palpable lymphadenopathy in the cervical, axillary or inguinal LUNGS: clear to auscultation and percussion with normal breathing effort HEART: regular rate & rhythm and no murmurs and no lower extremity edema ABDOMEN:abdomen  soft, non-tender and normal bowel sounds Musculoskeletal:no cyanosis of digits and no clubbing  PSYCH: alert & oriented x 3 with fluent speech NEURO: no focal motor/sensory deficits  LABORATORY DATA:  I have reviewed the data as listed Lab Results  Component Value Date   HGB 11.6* 10/24/2014   ASSESSMENT AND PLAN:  Neoplasm of right breast, primary tumor staging category Tis: ductal carcinoma in situ (DCIS) Right breast high-grade DCIS Tis N0 M0 stage 0 status post right lumpectomy for 1 2016: DCIS with calcifications high-grade 1.8 cm broadly present at the posterior margin which is a fascia, ER 7%, PR 0% status post radiation therapy 12/08/2014 to 01/30/2015  Pathology and radiology review: I discussed the details of pathology report and provided a copy of it to the patient. I described the difference between DCIS and invasive breast cancer significance of ER and PR receptors. Given the low degree positive estrogen receptors, the response to benefit from anti-estrogen therapy tend to be lesser.  Recommendation: 1. Adjuvant antiestrogen therapy with tamoxifen 20 mg daily for 5 years 2. Tamoxifen counseling: We discussed the risks and benefits of tamoxifen. These include but not limited to insomnia, hot flashes, mood changes, vaginal dryness, and weight gain. Although rare, serious side effects including endometrial cancer, risk of blood clots were also discussed. We strongly believe that the benefits far outweigh the risks. Patient understands these risks  and consented to starting treatment. Planned treatment duration is 5 years.  I discussed with the patient that just with surgery and radiation her risk of recurrence would be around 7%. By adding antiestrogen therapy the risk would be reduced to 2-3%. After listening to the risks and benefits of tamoxifen therapy, patient decided not to initiate antiestrogen therapy.  She would like to follow with Dr. Valere Dross and Dr. Prince Solian for surveillance  for breast cancer. Return to clinic as needed. All questions were answered. The patient knows to call the clinic with any problems, questions or concerns.    Rulon Eisenmenger, MD 1:30 PM

## 2015-03-25 ENCOUNTER — Telehealth: Payer: Self-pay | Admitting: Adult Health

## 2015-03-25 ENCOUNTER — Other Ambulatory Visit: Payer: Self-pay | Admitting: Adult Health

## 2015-03-25 DIAGNOSIS — D0511 Intraductal carcinoma in situ of right breast: Secondary | ICD-10-CM

## 2015-03-25 NOTE — Telephone Encounter (Signed)
I briefly spoke with Ms. Switala to congratulate her on completing her treatment for breast cancer and share with her the purpose and goals of the survivorship clinic.  She is open to being seen in the clinic by our breast survivorship NP.  I will place that referral today for the patient to be seen sometime in October.  She agreed to this plan and voiced appreciation for the call.  We look forward to participating in her care.   Mike Craze, NP [facility] [phone]

## 2015-03-27 ENCOUNTER — Telehealth: Payer: Self-pay | Admitting: Hematology and Oncology

## 2015-03-27 NOTE — Telephone Encounter (Signed)
Spoke with patient and she is aware of her survivorship appointment

## 2015-04-30 ENCOUNTER — Encounter: Payer: BLUE CROSS/BLUE SHIELD | Admitting: Nurse Practitioner

## 2015-05-28 ENCOUNTER — Encounter: Payer: Self-pay | Admitting: Nurse Practitioner

## 2015-05-28 ENCOUNTER — Encounter: Payer: BLUE CROSS/BLUE SHIELD | Admitting: Nurse Practitioner

## 2015-05-28 ENCOUNTER — Telehealth: Payer: Self-pay | Admitting: Hematology and Oncology

## 2015-05-28 DIAGNOSIS — D0511 Intraductal carcinoma in situ of right breast: Secondary | ICD-10-CM

## 2015-05-28 NOTE — Progress Notes (Signed)
The Survivorship Care Plan was mailed to Kara Mccullough as she reported not being able to come in to the Survivorship Clinic for an in-person visit at this time. A letter was mailed to her outlining the purpose of the content of the care plan, as well as encouraging her to reach out to me with any questions or concerns.  My business card was included in the correspondence to the patient as well.  A copy of the care plan was also routed/faxed/mailed to Horatio Pel, MD, the patient's PCP.  I will not be placing any follow-up appointments to the Survivorship Clinic for Kara Mccullough, but I am happy to see her at any time in the future for any survivorship concerns that may arise. Thank you for allowing me to participate in her care!  Kenn File, [place] [phone]

## 2015-05-28 NOTE — Telephone Encounter (Signed)
Returned call to patient re cx 11/3 due to she cannot make it and not being sure what the appointment is about. Not able to reach and left message confirming cancellation and asked that patient call office to reschedule and to receive additional information re the SCP visit. Message to HM/GD.

## 2015-07-07 ENCOUNTER — Ambulatory Visit: Payer: BLUE CROSS/BLUE SHIELD | Admitting: Radiation Oncology

## 2015-07-08 ENCOUNTER — Ambulatory Visit
Admission: RE | Admit: 2015-07-08 | Discharge: 2015-07-08 | Disposition: A | Discharge status: Home / Self Care | Payer: BLUE CROSS/BLUE SHIELD | Source: Ambulatory Visit | Attending: Radiation Oncology | Admitting: Radiation Oncology

## 2015-07-08 ENCOUNTER — Ambulatory Visit
Admission: RE | Admit: 2015-07-08 | Payer: BLUE CROSS/BLUE SHIELD | Source: Ambulatory Visit | Admitting: Radiation Oncology

## 2015-07-09 ENCOUNTER — Encounter: Payer: Self-pay | Admitting: Radiation Oncology

## 2015-07-09 NOTE — Progress Notes (Signed)
Chart note: The patient was a no-show for her scheduled follow-up visit yesterday.  She is to be rescheduled.

## 2015-08-10 ENCOUNTER — Other Ambulatory Visit: Payer: Self-pay | Admitting: Surgery

## 2015-08-10 DIAGNOSIS — D0592 Unspecified type of carcinoma in situ of left breast: Secondary | ICD-10-CM

## 2015-08-11 ENCOUNTER — Other Ambulatory Visit: Payer: Self-pay | Admitting: Surgery

## 2015-08-11 ENCOUNTER — Other Ambulatory Visit: Payer: Self-pay

## 2015-08-11 DIAGNOSIS — D0592 Unspecified type of carcinoma in situ of left breast: Secondary | ICD-10-CM

## 2015-08-13 ENCOUNTER — Other Ambulatory Visit: Payer: Self-pay | Admitting: Surgery

## 2015-08-13 ENCOUNTER — Ambulatory Visit
Admission: RE | Admit: 2015-08-13 | Discharge: 2015-08-13 | Disposition: A | Discharge status: Home / Self Care | Payer: BLUE CROSS/BLUE SHIELD | Source: Ambulatory Visit | Attending: Surgery | Admitting: Surgery

## 2015-08-13 DIAGNOSIS — D0592 Unspecified type of carcinoma in situ of left breast: Secondary | ICD-10-CM

## 2016-08-09 IMAGING — MG MM DIAG BREAST TOMO UNI RIGHT
7 series · 8 of 15 positions shown · non-contrast
Comparison: 09/29/2014 and 08/15/2013

ADDENDUM:
Stereotactic biopsy of right breast calcifications was attempted.
The breast was placed in multiple different positions within the
stereotactic unit. The breast was positioned, and stereotactic
biopsy with the petite needle was planned, and an incision was made,
but when attempting to advance the needle into its position for
biopsy, the thin nature of the breast in combination with its
compressibility, precluded with the ability to biopsy safely.
Therefore, biopsy was canceled. I took the patient to the ultrasound
room to see if calcifications were visible. I could not detect the
cluster of calcifications on ultrasound and there was no mass.

Therefore, the patient will be referred to [REDACTED]
for consultation for excisional biopsy. She has an appointment with
Dr. Streeking on October 27, 2014.
CLINICAL DATA: 43-year-old patient recalled from screening for
calcifications in the right breast. The patient is asymptomatic.
EXAM:
DIGITAL DIAGNOSTIC RIGHT MAMMOGRAM WITH IMPLANTS, 3D TOMOSYNTHESIS
WITH CAD
The patient has retropectoral implants. Implant displaced views were
performed.

[R ML (1 of 2)]
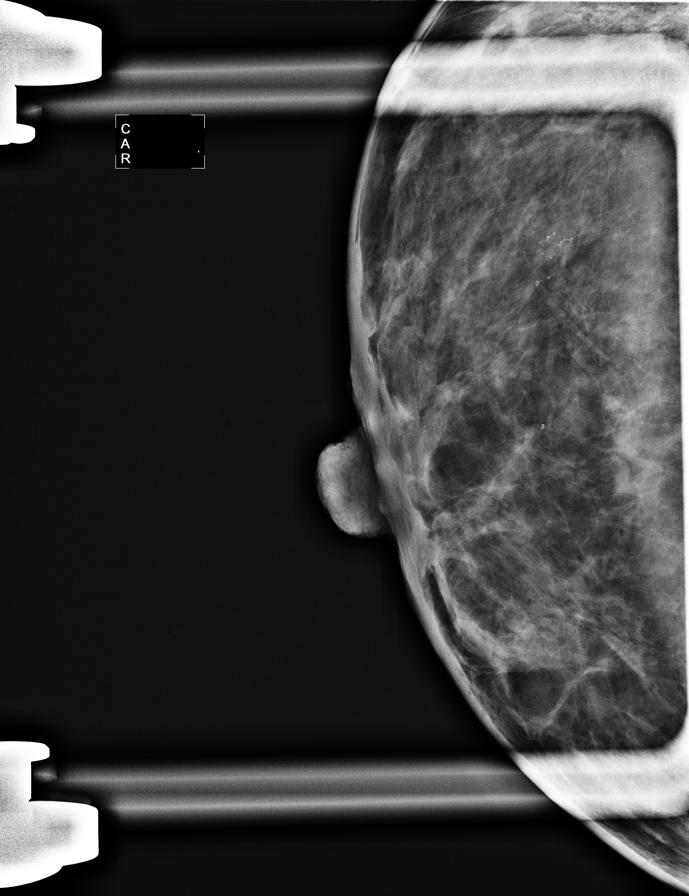

[R ML (2 of 2)]
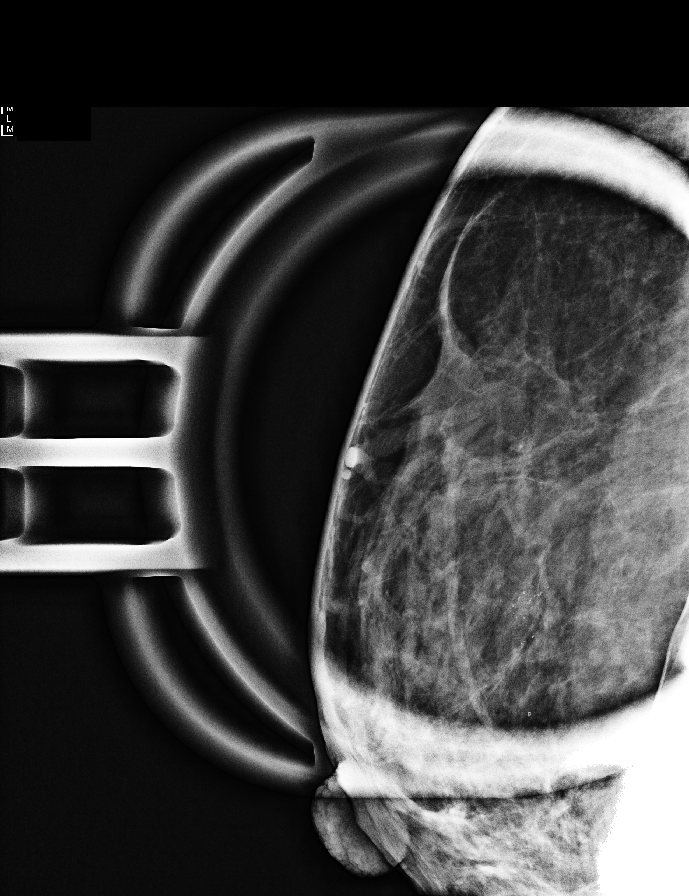

[R CC (1 of 2)]
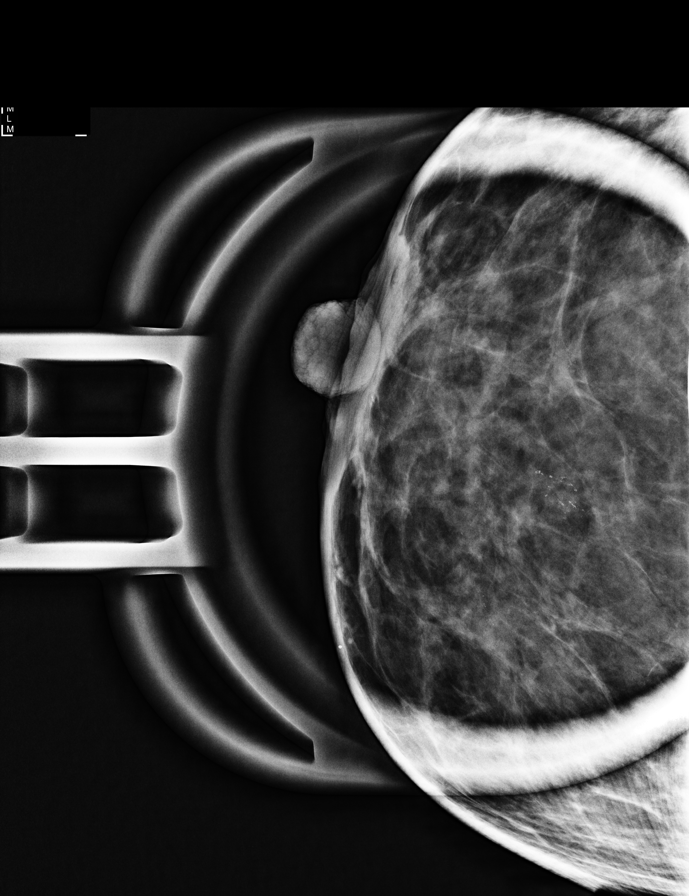

[R CC (2 of 2)]
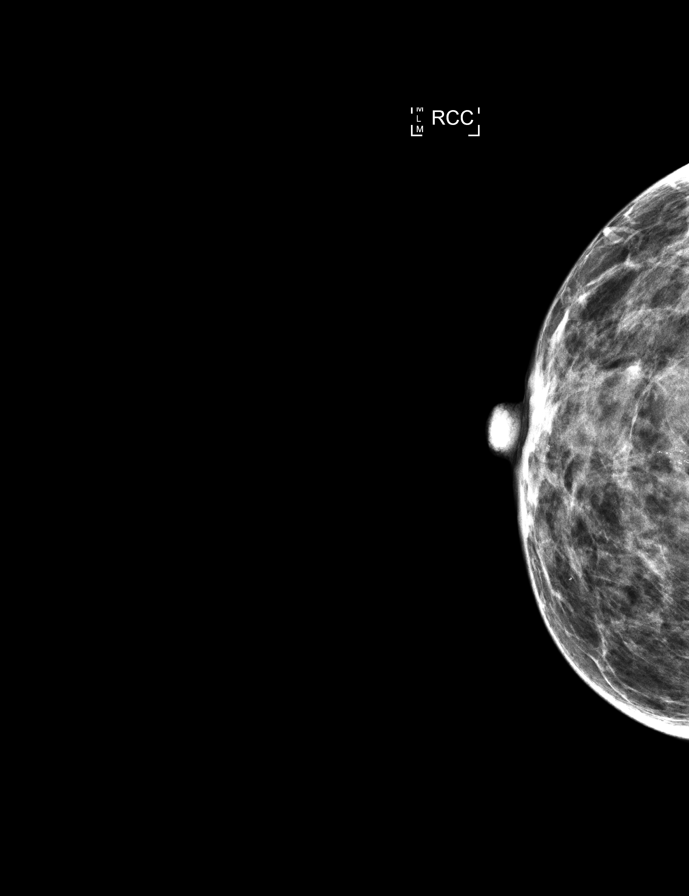

[R MLO]
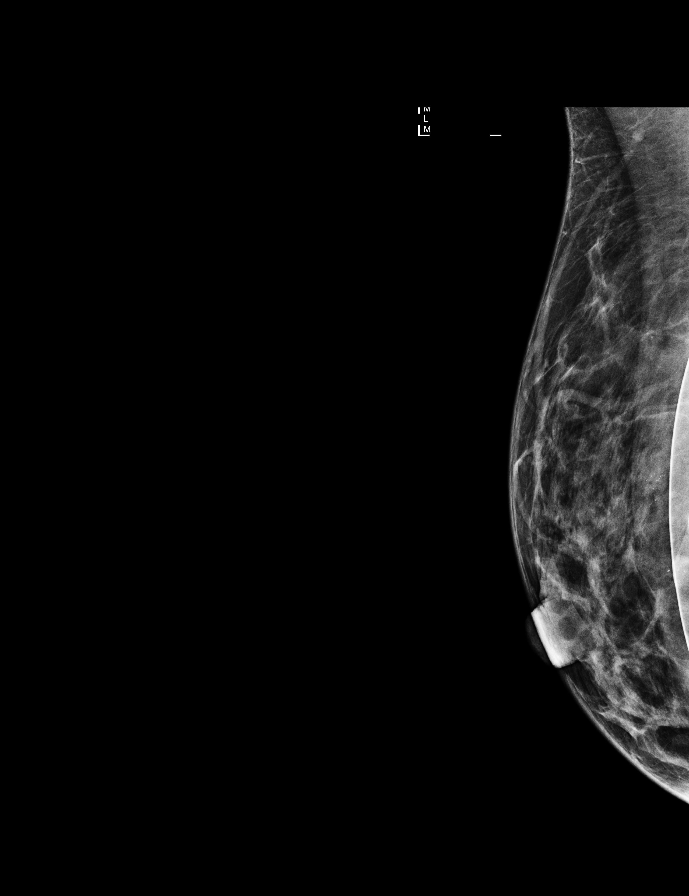

[R MLO tomo · 2 of 32 frames shown]
[frame 11/32]
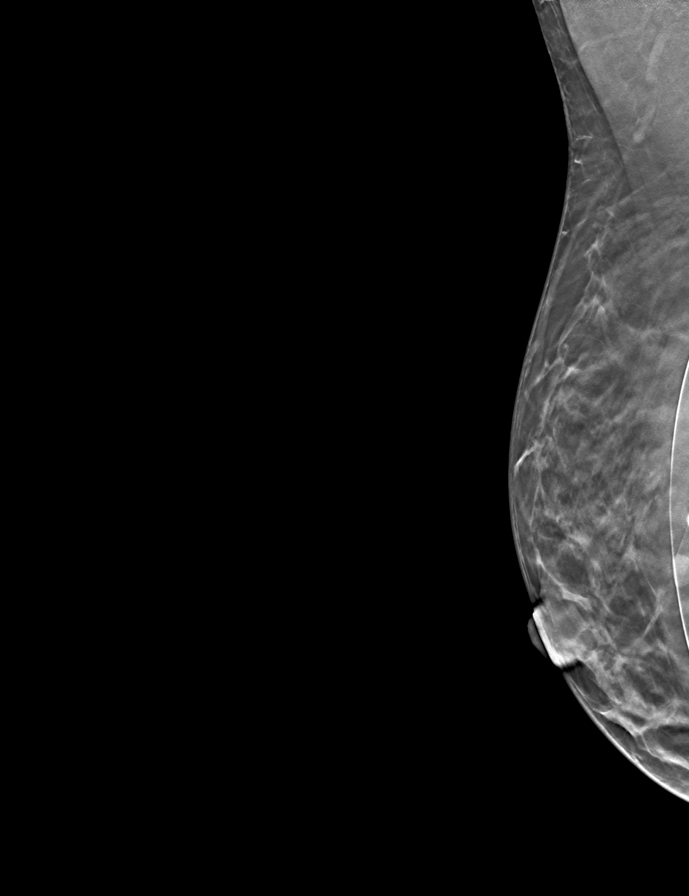
[frame 17/32]
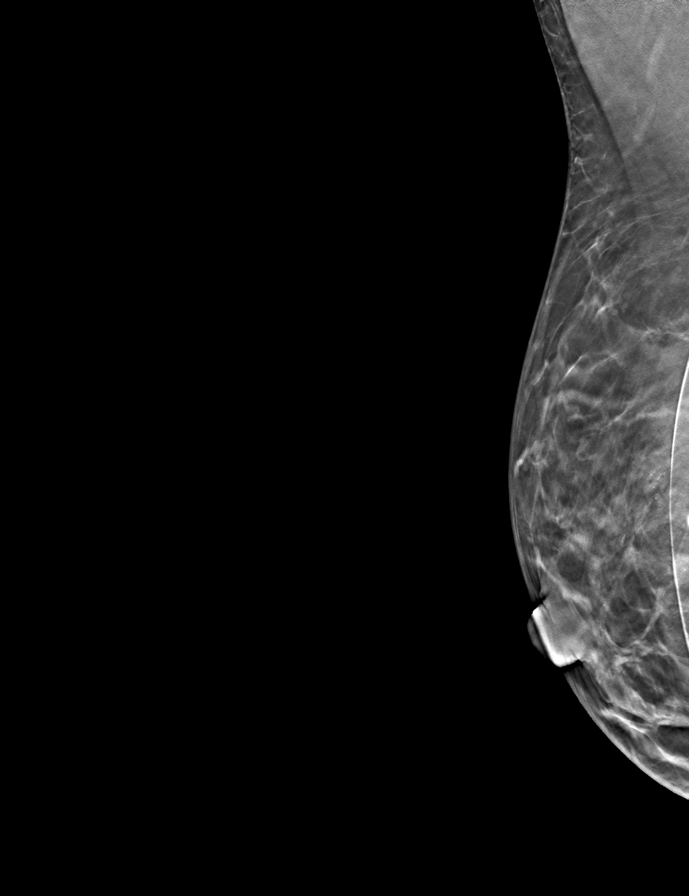

[R CC tomo · tomo slice 13/26.0]
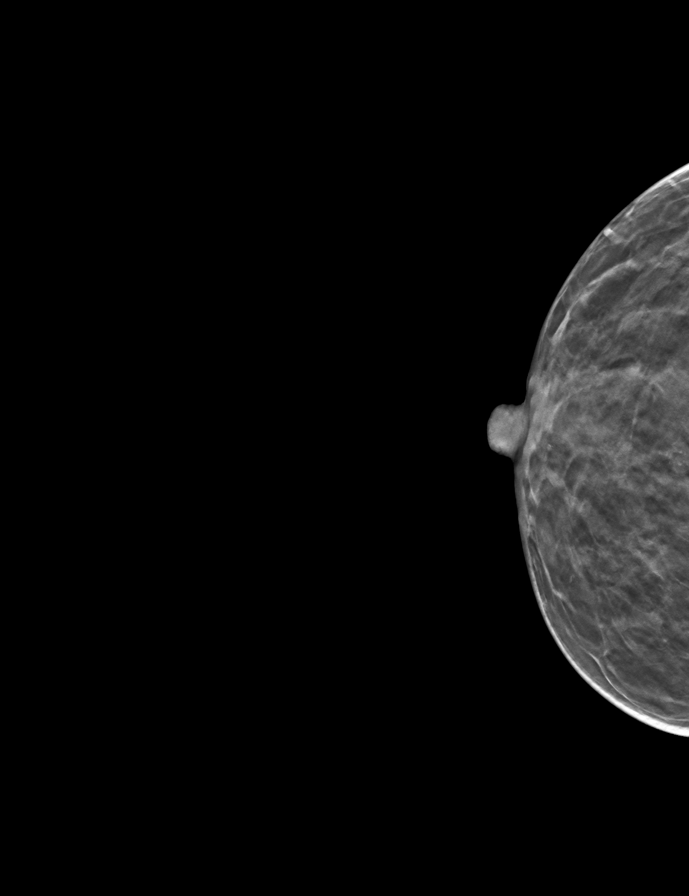

[8 of 15 positions shown; findings below may reference images not displayed]

ACR Breast Density Category c: The breast tissue is heterogeneously
dense, which may obscure small masses.
FINDINGS: Magnification views of the right breast demonstrate pleomorphic and
casting type calcifications in the upper central to slightly medial
right breast. The dominant group of calcifications spans 11 mm in
the approximate 1 o'clock position of the right breast.
Magnification views reveal additional but less numerous
calcifications medial and anterior to the dominant group of
calcifications. There are a few clustered calcifications in the
immediate retroareolar right breast. In total, the calcifications of
concern span 2.4 cm transverse diameter by 3.3 cm superior to
inferior via 3.8 cm AP diameter. These calcifications have a
segmental distribution and are suspicious for ductal carcinoma in
situ. Implant displaced whole breast tomogram views demonstrate no
evidence of mass or distortion in the right breast.

Mammographic images were processed with CAD.
IMPRESSION: Suspicious appearance and distribution of calcifications in the
right breast. These are suspicious for malignancy and biopsy is
recommended.

RECOMMENDATION:
Stereotactic biopsy of right breast calcifications is recommended.
The patient is able to stay for attempt at biopsy today. Given the
thin compression of the breast with implant displaced, stereotactic
biopsy may not be possible, but will be attempted.

I have discussed the findings and recommendations with the patient.
Results were also provided in writing at the conclusion of the
visit. If applicable, a reminder letter will be sent to the patient
regarding the next appointment.

BI-RADS CATEGORY  4: Suspicious.

## 2016-09-21 ENCOUNTER — Other Ambulatory Visit: Payer: Self-pay | Admitting: Surgery

## 2016-09-21 DIAGNOSIS — Z9889 Other specified postprocedural states: Secondary | ICD-10-CM

## 2016-09-26 ENCOUNTER — Other Ambulatory Visit: Payer: Self-pay | Admitting: Surgery

## 2016-09-26 ENCOUNTER — Ambulatory Visit
Admission: RE | Admit: 2016-09-26 | Discharge: 2016-09-26 | Disposition: A | Discharge status: Home / Self Care | Payer: PRIVATE HEALTH INSURANCE | Source: Ambulatory Visit | Attending: Surgery | Admitting: Surgery

## 2016-09-26 DIAGNOSIS — Z9889 Other specified postprocedural states: Secondary | ICD-10-CM

## 2016-09-26 HISTORY — DX: Personal history of irradiation: Z92.3

## 2016-10-05 ENCOUNTER — Encounter: Payer: Self-pay | Admitting: Podiatry

## 2016-10-05 ENCOUNTER — Ambulatory Visit: Payer: BLUE CROSS/BLUE SHIELD

## 2016-10-05 ENCOUNTER — Ambulatory Visit (INDEPENDENT_AMBULATORY_CARE_PROVIDER_SITE_OTHER): Payer: BLUE CROSS/BLUE SHIELD | Admitting: Podiatry

## 2016-10-05 VITALS — BP 127/75 | HR 74 | Ht 63.0 in | Wt 135.0 lb

## 2016-10-05 DIAGNOSIS — B351 Tinea unguium: Secondary | ICD-10-CM

## 2016-10-05 DIAGNOSIS — L600 Ingrowing nail: Secondary | ICD-10-CM

## 2016-10-05 DIAGNOSIS — M79674 Pain in right toe(s): Secondary | ICD-10-CM | POA: Diagnosis not present

## 2016-10-05 MED ORDER — TERBINAFINE HCL 250 MG PO TABS: 250.0000 mg | ORAL_TABLET | Freq: Every day | ORAL | 0 refills | Status: DC

## 2016-10-05 NOTE — Patient Instructions (Signed)

## 2016-10-05 NOTE — Progress Notes (Signed)
Subjective:     Patient ID: Kara Mccullough, female   DOB: [birth year], 46 y.o.   MRN: [id number]  HPI patient presents stating her second toe on her right foot has become thickened and she has discoloration of other nails that's localized in nature states she's been running and did run a half marathon and has developed more problems with that second nail since she began running   Review of Systems  All other systems reviewed and are negative.      Objective:   Physical Exam  Constitutional: She is oriented to person, place, and time.  Cardiovascular: Intact distal pulses.   Musculoskeletal: Normal range of motion.  Neurological: She is oriented to person, place, and time.  Skin: Skin is warm.  Nursing note and vitals reviewed.  Neurovascular status intact muscle strength adequate range of motion within normal limits with patient found to have a damaged thickened second nail right and nailbeds that have become yellow discolored bilateral. Patient has good digital perfusion and is well oriented 3     Assessment:     Damaged right second nail with mycotic nail component of remaining adjacent nails    Plan:     H&P all conditions discussed. At this point I recommended nail removal allowing new nail to regrow and I did explain that with this the possibilities for damaged long-term nail is present. I did infiltrate the right second digit 60 mg Xylocaine Marcaine mixture removed the nail and allowing new nail to regrow. Then discussed adjacent nails and she wants laser therapy and she is scheduled for laser we will take 60 days of Lamisil daily and topical antifungal agent. Patient will be seen back for procedure

## 2016-10-05 NOTE — Progress Notes (Signed)
   Subjective:    Patient ID: Kara Mccullough, female    DOB: 05/06/1971, 46 y.o.   MRN: [id number]  HPI Chief Complaint  Patient presents with  . Nail Problem    Right foot, 2nd toe; nail discoloration & thickened nail; pt stated, "has been going on for the past couple of months; started running for the past 3-4 months"      Review of Systems  All other systems reviewed and are negative.      Objective:   Physical Exam        Assessment & Plan:

## 2016-10-11 ENCOUNTER — Ambulatory Visit: Payer: BLUE CROSS/BLUE SHIELD

## 2016-10-13 ENCOUNTER — Ambulatory Visit (INDEPENDENT_AMBULATORY_CARE_PROVIDER_SITE_OTHER): Payer: Self-pay | Admitting: Podiatry

## 2016-10-13 DIAGNOSIS — B351 Tinea unguium: Secondary | ICD-10-CM

## 2016-10-14 DIAGNOSIS — B351 Tinea unguium: Secondary | ICD-10-CM

## 2016-10-17 NOTE — Progress Notes (Signed)
Pt presents with mycotic infection of nails 1-5 bilateral  All other systems are negative  Laser therapy administered to affected nails and tolerated well. All safety precautions were in place. Re-appointed in 1 month for 2nd of 3 treatments

## 2016-11-14 ENCOUNTER — Ambulatory Visit (INDEPENDENT_AMBULATORY_CARE_PROVIDER_SITE_OTHER): Payer: BLUE CROSS/BLUE SHIELD | Admitting: Podiatry

## 2016-11-14 DIAGNOSIS — B351 Tinea unguium: Secondary | ICD-10-CM

## 2016-11-14 DIAGNOSIS — M79674 Pain in right toe(s): Secondary | ICD-10-CM

## 2016-11-14 DIAGNOSIS — R52 Pain, unspecified: Secondary | ICD-10-CM

## 2016-11-18 NOTE — Progress Notes (Signed)
Pt presents with mycotic infection of nails 1-5 bilateral  All other systems are negative  Laser therapy administered to affected nails and tolerated well. All safety precautions were in place. Re-appointed in 4 weeks for 3rd treatment 

## 2016-12-12 ENCOUNTER — Ambulatory Visit (INDEPENDENT_AMBULATORY_CARE_PROVIDER_SITE_OTHER): Payer: Self-pay | Admitting: Podiatry

## 2016-12-12 DIAGNOSIS — B351 Tinea unguium: Secondary | ICD-10-CM

## 2016-12-12 DIAGNOSIS — M79673 Pain in unspecified foot: Secondary | ICD-10-CM

## 2016-12-13 NOTE — Progress Notes (Signed)
Pt presents with mycotic infection of nails 1-5 bilateral  All other systems are negative  Laser therapy administered to affected nails and tolerated well. All safety precautions were in place. Re-appointed prn 

## 2017-10-05 ENCOUNTER — Other Ambulatory Visit: Payer: Self-pay | Admitting: Surgery

## 2017-10-05 DIAGNOSIS — Z853 Personal history of malignant neoplasm of breast: Secondary | ICD-10-CM

## 2017-10-19 ENCOUNTER — Ambulatory Visit
Admission: RE | Admit: 2017-10-19 | Discharge: 2017-10-19 | Disposition: A | Discharge status: Home / Self Care | Payer: BLUE CROSS/BLUE SHIELD | Source: Ambulatory Visit | Attending: Surgery | Admitting: Surgery

## 2017-10-19 ENCOUNTER — Other Ambulatory Visit: Payer: Self-pay | Admitting: Surgery

## 2017-10-19 DIAGNOSIS — Z853 Personal history of malignant neoplasm of breast: Secondary | ICD-10-CM

## 2019-04-26 ENCOUNTER — Other Ambulatory Visit: Payer: Self-pay | Admitting: Obstetrics and Gynecology

## 2019-04-26 DIAGNOSIS — Z9889 Other specified postprocedural states: Secondary | ICD-10-CM

## 2019-05-06 ENCOUNTER — Other Ambulatory Visit: Payer: Self-pay | Admitting: Obstetrics and Gynecology

## 2019-05-06 ENCOUNTER — Other Ambulatory Visit: Payer: Self-pay

## 2019-05-06 ENCOUNTER — Ambulatory Visit
Admission: RE | Admit: 2019-05-06 | Discharge: 2019-05-06 | Disposition: A | Discharge status: Home / Self Care | Payer: BC Managed Care – PPO | Source: Ambulatory Visit | Attending: Obstetrics and Gynecology | Admitting: Obstetrics and Gynecology

## 2019-05-06 DIAGNOSIS — N6320 Unspecified lump in the left breast, unspecified quadrant: Secondary | ICD-10-CM

## 2019-05-06 DIAGNOSIS — Z9889 Other specified postprocedural states: Secondary | ICD-10-CM

## 2019-05-08 ENCOUNTER — Other Ambulatory Visit: Payer: BC Managed Care – PPO

## 2019-05-14 ENCOUNTER — Other Ambulatory Visit: Payer: Self-pay

## 2019-05-14 ENCOUNTER — Ambulatory Visit
Admission: RE | Admit: 2019-05-14 | Discharge: 2019-05-14 | Disposition: A | Discharge status: Home / Self Care | Payer: BC Managed Care – PPO | Source: Ambulatory Visit | Attending: Obstetrics and Gynecology | Admitting: Obstetrics and Gynecology

## 2019-05-14 DIAGNOSIS — N6320 Unspecified lump in the left breast, unspecified quadrant: Secondary | ICD-10-CM

## 2019-05-24 ENCOUNTER — Ambulatory Visit: Payer: Self-pay | Admitting: Surgery

## 2019-05-24 NOTE — H&P (Signed)
History of Present Illness Kara Mccullough. Kara Delvecchio MD; 05/24/2019 10:54 AM) The patient is a 48 year old female who presents with a breast mass. The patient is being seen in follow-up for Stage 0 right breast cancer. The patient underwent right seed localized lumpectomy on 10/24/14. This area was not biopsied prior to surgery. 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 The radial margins are negative but the posterior margin is probably positive. The posterior margin is muscle. She has very thin pectoralis muscle secondary to the retropectoral implants. ER is weakly positive at 7% and PR is negative. The patient had a consultation with radiation oncology as well as genetics. Genetic testing was negative. She completed radiation therapy by Kara Mccullough in July of 2016. The patient never took any pain medicine after surgery. She is satisfied with the appearance and contour of her breast. She met with Dr. Lindi Mccullough and opted against anti-estrogen therapy. No new complaints.  The patient underwent her annual diagnostic mammograms on 05/06/19. Usually she has these done in the spring mammograms were delayed by Covid. Imaging showed a mass in the medial left breast at 9:30 about 6 cm from the nipple. On ultrasound this measures 0.4 x 0.5 x 0.4 cm with spiculated margins. This was unable to be reached with biopsy due to the proximity of the implant. She is referred to [place] for lumpectomy for diagnosis.  CLINICAL DATA: Status post RIGHT lumpectomy with radiation therapy April 2016.  EXAM: DIGITAL DIAGNOSTIC BILATERAL MAMMOGRAM WITH IMPLANTS, CAD AND TOMO  ULTRASOUND LEFT BREAST  The patient has retropectoralsaline implants. Standard and implant displaced views were performed.  COMPARISON: Previous exam(s).  ACR Breast Density Category b: There are scattered areas of fibroglandular density.  FINDINGS: Post operative changes are seen in the RIGHTbreast. RIGHT breast is otherwise negative.  A  possible mass in the MEDIAL aspect of the LEFT breast is confirmed on spot compression views. Mass measures approximately 6 millimeters and has spiculated margins.  On physical exam, I palpate discrete firm superficial mass in the 9:30 o'clock location of the LEFT breast 6 centimeters from the nipple.  Targeted ultrasound is performed, showing a taller than wide hypoechoic oval mass in the 9:30 o'clock location of the LEFT breast 6 centimeters from the nipple which measures 0.4 x 0.5 x 0.4 centimeters. Margins are spiculated. There is internal blood flow on Doppler evaluation. Mass is immediately adjacent to the implant capsule.  Evaluation of the LEFT axilla is negative for adenopathy.  Mammographic images were processed with CAD.  IMPRESSION: 1. Suspicious mass in the 9:30 o'clock location of the LEFT breast for which biopsy is recommended. The mass is adjacent to the implant. We discussed the importance of pre biopsy imaging to determine if there is a safe approach for ultrasound-guided core biopsy. We also discussed the small risk of implant rupture. 2. No LEFT axillary adenopathy. 3. Postoperative changes in the RIGHT breast.  RECOMMENDATION: Ultrasound-guided core biopsy of mass in the 9:30 o'clock location of the LEFT breast.  I have discussed the findings and recommendations with the patient. If applicable, a reminder letter will be sent to the patient regarding the next appointment.  BI-RADS CATEGORY 4: Suspicious.  Electronically Signed: By: Kara Mccullough M.D. On: 05/06/2019 09:17  The patient returned on 05/14/2019 for ultrasound-guided LEFT breast biopsy.  Ultrasound was performed to assess the position of the palpable mass. The 0.5 cm mass is again identified at the 9:30 position 6 cm from the nipple immediately adjacent to the skin  and pectoralis muscle/implant. Despite numerous patient positions and probe orientations, I could not identified a safe  biopsy trajectory to avoid the implant. Therefore the biopsy was not performed.  Recommendation: Surgical consultation for palpable UPPER INNER LEFT breast mass excision.   Electronically Signed By: Kara Mccullough M.D. On: 05/14/2019 11:03     Problem List/Past Medical Kara Key K. Devanie Galanti, MD; 05/24/2019 10:54 AM) DCIS (DUCTAL CARCINOMA IN SITU), RIGHT (D05.11) MAMMOGRAPHIC MICROCALCIFICATION FOUND ON DIAGNOSTIC IMAGING OF BREAST (R92.0) HISTORY OF DUCTAL CARCINOMA IN SITU (DCIS) OF BREAST (Z86.000) LEFT BREAST MASS (N63.20)  Past Surgical History (Kara Mccullough K. Rosezella Kronick, MD; 05/24/2019 10:54 AM) Breast Augmentation Bilateral.  Diagnostic Studies History Kara Key K. Donnae Michels, MD; 05/24/2019 10:54 AM) Colonoscopy never Mammogram within last year Pap Smear 1-5 years ago  Allergies Kara Mccullough, Kara Mccullough; 05/24/2019 10:20 AM) No Known Drug Allergies [10/14/2014]: Allergies Reconciled  Medication History Kara Mccullough, CMA; 05/24/2019 10:21 AM) Biotin ('10MG'$  Tablet, Oral) Active. Multivitamins (Oral) Active. Seasonal IC (Oral) Active. Norethindrone (0.'35MG'$  Tablet, Oral) Active. Medications Reconciled  Social History Kara Mccullough. Kara Baum, MD; 05/24/2019 10:54 AM) Alcohol use Occasional alcohol use. Caffeine use Coffee. No drug use Tobacco use Never smoker.  Family History Kara Mccullough. Kara Caudell, MD; 05/24/2019 10:54 AM) Cancer Family Members In General. Hypertension Family Members In General. Respiratory Condition Family Members In General. Thyroid problems Mother.  Pregnancy / Birth History Kara Mccullough. Kara Batte, MD; 05/24/2019 10:54 AM) Age at menarche 35 years. Contraceptive History Oral contraceptives. Gravida 1 Maternal age 64-35 Para 1 Regular periods  Other Problems Kara Mccullough. Kara Hehir, MD; 05/24/2019 10:54 AM) No pertinent past medical history    Vitals Kara Mccullough CMA; 05/24/2019 10:20 AM) 05/24/2019 10:20 AM Weight: 128 lb Height:  64in Body Surface Area: 1.62 m Body Mass Index: 21.97 kg/m  Temp.: 67F  Pulse: 91 (Regular)  BP: 120/84 (Sitting, Left Arm, Standard)        Physical Exam Kara Key K. Kimmerly Lora MD; 05/24/2019 10:56 AM)  The physical exam findings are as follows: Note:WDWN in NAD Eyes: Pupils equal, round; sclera anicteric HENT: Oral mucosa moist; good dentition Neck: No masses palpated, no thyromegaly Breasts: symmetric; implants in place with no sign of capsule contracture; no nipple retraction or discharge; no masses on right side; left breast 9:30-10:00, easily palpable 5 mm mass just deep to the skin - no overlying skin changes. The mass is firm. Lungs: CTA bilaterally; normal respiratory effort CV: Regular rate and rhythm; no murmurs; extremities well-perfused with no edema Abd: +bowel sounds, soft, non-tender, no palpable organomegaly; no palpable hernias Skin: Warm, dry; no sign of jaundice Psychiatric - alert and oriented x 4; calm mood and affect    Assessment & Plan Kara Key K. Pantelis Elgersma MD; 05/24/2019 10:33 AM)  LEFT BREAST MASS (N63.20)  Current Plans Schedule for Surgery - left breast lumpectomy. The surgical procedure has been discussed with the patient. Potential risks, benefits, alternative treatments, and expected outcomes have been explained. All of the patient's questions at this time have been answered. The likelihood of reaching the patient's treatment goal is good. The patient understand the proposed surgical procedure and wishes to proceed.  HISTORY OF DUCTAL CARCINOMA IN SITU (DCIS) OF BREAST (Z86.000) Impression: right breast 2017 - lumpectomy, XRT, no hormones

## 2019-05-24 NOTE — H&P (View-Only) (Signed)
History of Present Illness Kara Mccullough. Kara Dwan MD; 05/24/2019 10:54 AM) The patient is a 49 year old female who presents with a breast mass. The patient is being seen in follow-up for Stage 0 right breast cancer. The patient underwent right seed localized lumpectomy on 10/24/14. This area was not biopsied prior to surgery. 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 The radial margins are negative but the posterior margin is probably positive. The posterior margin is muscle. She has very thin pectoralis muscle secondary to the retropectoral implants. ER is weakly positive at 7% and PR is negative. The patient had a consultation with radiation oncology as well as genetics. Genetic testing was negative. She completed radiation therapy by Kara Mccullough in July of 2016. The patient never took any pain medicine after surgery. She is satisfied with the appearance and contour of her breast. She met with Dr. Lindi Mccullough and opted against anti-estrogen therapy. No new complaints.  The patient underwent her annual diagnostic mammograms on 05/06/19. Usually she has these done in the spring mammograms were delayed by Covid. Imaging showed a mass in the medial left breast at 9:30 about 6 cm from the nipple. On ultrasound this measures 0.4 x 0.5 x 0.4 cm with spiculated margins. This was unable to be reached with biopsy due to the proximity of the implant. She is referred to [place] for lumpectomy for diagnosis.  CLINICAL DATA: Status post RIGHT lumpectomy with radiation therapy April 2016.  EXAM: DIGITAL DIAGNOSTIC BILATERAL MAMMOGRAM WITH IMPLANTS, CAD AND TOMO  ULTRASOUND LEFT BREAST  The patient has retropectoralsaline implants. Standard and implant displaced views were performed.  COMPARISON: Previous exam(s).  ACR Breast Density Category b: There are scattered areas of fibroglandular density.  FINDINGS: Post operative changes are seen in the RIGHTbreast. RIGHT breast is otherwise negative.  A  possible mass in the MEDIAL aspect of the LEFT breast is confirmed on spot compression views. Mass measures approximately 6 millimeters and has spiculated margins.  On physical exam, I palpate discrete firm superficial mass in the 9:30 o'clock location of the LEFT breast 6 centimeters from the nipple.  Targeted ultrasound is performed, showing a taller than wide hypoechoic oval mass in the 9:30 o'clock location of the LEFT breast 6 centimeters from the nipple which measures 0.4 x 0.5 x 0.4 centimeters. Margins are spiculated. There is internal blood flow on Doppler evaluation. Mass is immediately adjacent to the implant capsule.  Evaluation of the LEFT axilla is negative for adenopathy.  Mammographic images were processed with CAD.  IMPRESSION: 1. Suspicious mass in the 9:30 o'clock location of the LEFT breast for which biopsy is recommended. The mass is adjacent to the implant. We discussed the importance of pre biopsy imaging to determine if there is a safe approach for ultrasound-guided core biopsy. We also discussed the small risk of implant rupture. 2. No LEFT axillary adenopathy. 3. Postoperative changes in the RIGHT breast.  RECOMMENDATION: Ultrasound-guided core biopsy of mass in the 9:30 o'clock location of the LEFT breast.  I have discussed the findings and recommendations with the patient. If applicable, a reminder letter will be sent to the patient regarding the next appointment.  BI-RADS CATEGORY 4: Suspicious.  Electronically Signed: By: Kara Mccullough M.D. On: 05/06/2019 09:17  The patient returned on 05/14/2019 for ultrasound-guided LEFT breast biopsy.  Ultrasound was performed to assess the position of the palpable mass. The 0.5 cm mass is again identified at the 9:30 position 6 cm from the nipple immediately adjacent to the skin  and pectoralis muscle/implant. Despite numerous patient positions and probe orientations, I could not identified a safe  biopsy trajectory to avoid the implant. Therefore the biopsy was not performed.  Recommendation: Surgical consultation for palpable UPPER INNER LEFT breast mass excision.   Electronically Signed By: Kara Mccullough M.D. On: 05/14/2019 11:03     Problem List/Past Medical Kara Key K. Vernesha Talbot, MD; 05/24/2019 10:54 AM) DCIS (DUCTAL CARCINOMA IN SITU), RIGHT (D05.11) MAMMOGRAPHIC MICROCALCIFICATION FOUND ON DIAGNOSTIC IMAGING OF BREAST (R92.0) HISTORY OF DUCTAL CARCINOMA IN SITU (DCIS) OF BREAST (Z86.000) LEFT BREAST MASS (N63.20)  Past Surgical History (Kara Mccullough K. Kara Goynes, MD; 05/24/2019 10:54 AM) Breast Augmentation Bilateral.  Diagnostic Studies History Kara Key K. Emoree Sasaki, MD; 05/24/2019 10:54 AM) Colonoscopy never Mammogram within last year Pap Smear 1-5 years ago  Allergies Kara Mccullough, Kara Mccullough; 05/24/2019 10:20 AM) No Known Drug Allergies [10/14/2014]: Allergies Reconciled  Medication History Kara Mccullough, CMA; 05/24/2019 10:21 AM) Biotin ('10MG'$  Tablet, Oral) Active. Multivitamins (Oral) Active. Seasonal IC (Oral) Active. Norethindrone (0.'35MG'$  Tablet, Oral) Active. Medications Reconciled  Social History Kara Mccullough. Darika Ildefonso, MD; 05/24/2019 10:54 AM) Alcohol use Occasional alcohol use. Caffeine use Coffee. No drug use Tobacco use Never smoker.  Family History Kara Mccullough. Donnette Macmullen, MD; 05/24/2019 10:54 AM) Cancer Family Members In General. Hypertension Family Members In General. Respiratory Condition Family Members In General. Thyroid problems Mother.  Pregnancy / Birth History Kara Mccullough. Daanish Copes, MD; 05/24/2019 10:54 AM) Age at menarche 61 years. Contraceptive History Oral contraceptives. Gravida 1 Maternal age 8-35 Para 1 Regular periods  Other Problems Kara Mccullough. Celeste Candelas, MD; 05/24/2019 10:54 AM) No pertinent past medical history    Vitals Kara Mccullough CMA; 05/24/2019 10:20 AM) 05/24/2019 10:20 AM Weight: 128 lb Height:  64in Body Surface Area: 1.62 m Body Mass Index: 21.97 kg/m  Temp.: 47F  Pulse: 91 (Regular)  BP: 120/84 (Sitting, Left Arm, Standard)        Physical Exam Kara Key K. Estell Dillinger MD; 05/24/2019 10:56 AM)  The physical exam findings are as follows: Note:WDWN in NAD Eyes: Pupils equal, round; sclera anicteric HENT: Oral mucosa moist; good dentition Neck: No masses palpated, no thyromegaly Breasts: symmetric; implants in place with no sign of capsule contracture; no nipple retraction or discharge; no masses on right side; left breast 9:30-10:00, easily palpable 5 mm mass just deep to the skin - no overlying skin changes. The mass is firm. Lungs: CTA bilaterally; normal respiratory effort CV: Regular rate and rhythm; no murmurs; extremities well-perfused with no edema Abd: +bowel sounds, soft, non-tender, no palpable organomegaly; no palpable hernias Skin: Warm, dry; no sign of jaundice Psychiatric - alert and oriented x 4; calm mood and affect    Assessment & Plan Kara Key K. Debara Kamphuis MD; 05/24/2019 10:33 AM)  LEFT BREAST MASS (N63.20)  Current Plans Schedule for Surgery - left breast lumpectomy. The surgical procedure has been discussed with the patient. Potential risks, benefits, alternative treatments, and expected outcomes have been explained. All of the patient's questions at this time have been answered. The likelihood of reaching the patient's treatment goal is good. The patient understand the proposed surgical procedure and wishes to proceed.  HISTORY OF DUCTAL CARCINOMA IN SITU (DCIS) OF BREAST (Z86.000) Impression: right breast 2017 - lumpectomy, XRT, no hormones

## 2019-05-30 ENCOUNTER — Encounter (HOSPITAL_BASED_OUTPATIENT_CLINIC_OR_DEPARTMENT_OTHER): Payer: Self-pay | Admitting: *Deleted

## 2019-05-30 ENCOUNTER — Other Ambulatory Visit: Payer: Self-pay

## 2019-06-03 ENCOUNTER — Other Ambulatory Visit (HOSPITAL_COMMUNITY)
Admission: RE | Admit: 2019-06-03 | Discharge: 2019-06-03 | Disposition: A | Discharge status: Home / Self Care | Payer: BC Managed Care – PPO | Source: Ambulatory Visit | Attending: Surgery | Admitting: Surgery

## 2019-06-03 DIAGNOSIS — Z01812 Encounter for preprocedural laboratory examination: Secondary | ICD-10-CM | POA: Diagnosis not present

## 2019-06-03 DIAGNOSIS — Z20828 Contact with and (suspected) exposure to other viral communicable diseases: Secondary | ICD-10-CM | POA: Insufficient documentation

## 2019-06-04 LAB — NOVEL CORONAVIRUS, NAA (HOSP ORDER, SEND-OUT TO REF LAB; TAT 18-24 HRS): SARS-CoV-2, NAA: NOT DETECTED

## 2019-06-06 ENCOUNTER — Ambulatory Visit (HOSPITAL_BASED_OUTPATIENT_CLINIC_OR_DEPARTMENT_OTHER): Payer: BC Managed Care – PPO | Admitting: Certified Registered"

## 2019-06-06 ENCOUNTER — Other Ambulatory Visit: Payer: Self-pay

## 2019-06-06 ENCOUNTER — Encounter (HOSPITAL_BASED_OUTPATIENT_CLINIC_OR_DEPARTMENT_OTHER): Payer: Self-pay

## 2019-06-06 ENCOUNTER — Ambulatory Visit (HOSPITAL_BASED_OUTPATIENT_CLINIC_OR_DEPARTMENT_OTHER)
Admission: RE | Admit: 2019-06-06 | Discharge: 2019-06-06 | Disposition: A | Discharge status: Home / Self Care | Payer: BC Managed Care – PPO | Attending: Surgery | Admitting: Surgery

## 2019-06-06 ENCOUNTER — Encounter (HOSPITAL_BASED_OUTPATIENT_CLINIC_OR_DEPARTMENT_OTHER)
Admission: RE | Disposition: A | Discharge status: Home / Self Care | Payer: Self-pay | Source: Home / Self Care | Attending: Surgery

## 2019-06-06 DIAGNOSIS — N6320 Unspecified lump in the left breast, unspecified quadrant: Secondary | ICD-10-CM | POA: Diagnosis present

## 2019-06-06 DIAGNOSIS — Z86000 Personal history of in-situ neoplasm of breast: Secondary | ICD-10-CM | POA: Diagnosis not present

## 2019-06-06 DIAGNOSIS — D242 Benign neoplasm of left breast: Secondary | ICD-10-CM | POA: Insufficient documentation

## 2019-06-06 DIAGNOSIS — Z923 Personal history of irradiation: Secondary | ICD-10-CM | POA: Insufficient documentation

## 2019-06-06 HISTORY — PX: BREAST LUMPECTOMY: SHX2

## 2019-06-06 LAB — POCT PREGNANCY, URINE: Preg Test, Ur: NEGATIVE

## 2019-06-06 SURGERY — BREAST LUMPECTOMY
Anesthesia: General | Site: Breast | Laterality: Left

## 2019-06-06 MED ORDER — CHLORHEXIDINE GLUCONATE CLOTH 2 % EX PADS: 6.0000 | MEDICATED_PAD | Freq: Once | CUTANEOUS | Status: DC

## 2019-06-06 MED ORDER — CEFAZOLIN SODIUM-DEXTROSE 2-4 GM/100ML-% IV SOLN
INTRAVENOUS | Status: AC
  Filled 2019-06-06: qty 100

## 2019-06-06 MED ORDER — ONDANSETRON HCL 4 MG/2ML IJ SOLN
INTRAMUSCULAR | Status: DC | PRN
  Administered 2019-06-06: 4 mg via INTRAVENOUS

## 2019-06-06 MED ORDER — KETOROLAC TROMETHAMINE 30 MG/ML IJ SOLN
INTRAMUSCULAR | Status: AC
  Filled 2019-06-06: qty 2

## 2019-06-06 MED ORDER — LIDOCAINE HCL (CARDIAC) PF 100 MG/5ML IV SOSY
PREFILLED_SYRINGE | INTRAVENOUS | Status: DC | PRN
  Administered 2019-06-06: 60 mg via INTRAVENOUS

## 2019-06-06 MED ORDER — PROPOFOL 10 MG/ML IV BOLUS
INTRAVENOUS | Status: DC | PRN
  Administered 2019-06-06: 200 mg via INTRAVENOUS

## 2019-06-06 MED ORDER — FENTANYL CITRATE (PF) 100 MCG/2ML IJ SOLN
INTRAMUSCULAR | Status: DC | PRN
  Administered 2019-06-06: 25 ug via INTRAVENOUS

## 2019-06-06 MED ORDER — MEPERIDINE HCL 25 MG/ML IJ SOLN: 6.2500 mg | INTRAMUSCULAR | Status: DC | PRN

## 2019-06-06 MED ORDER — HYDROMORPHONE HCL 1 MG/ML IJ SOLN: 0.2500 mg | INTRAMUSCULAR | Status: DC | PRN

## 2019-06-06 MED ORDER — HYDROCODONE-ACETAMINOPHEN 5-325 MG PO TABS: 1.0000 | ORAL_TABLET | Freq: Four times a day (QID) | ORAL | 0 refills | Status: AC | PRN

## 2019-06-06 MED ORDER — GABAPENTIN 300 MG PO CAPS
ORAL_CAPSULE | ORAL | Status: AC
  Filled 2019-06-06: qty 1

## 2019-06-06 MED ORDER — LACTATED RINGERS IV SOLN
INTRAVENOUS | Status: DC
  Administered 2019-06-06 (×2): via INTRAVENOUS

## 2019-06-06 MED ORDER — CEFAZOLIN SODIUM-DEXTROSE 2-4 GM/100ML-% IV SOLN
2.0000 g | INTRAVENOUS | Status: AC
  Administered 2019-06-06: 2 g via INTRAVENOUS

## 2019-06-06 MED ORDER — GLYCOPYRROLATE 0.2 MG/ML IJ SOLN
INTRAMUSCULAR | Status: DC | PRN
  Administered 2019-06-06 (×2): 0.1 mg via INTRAVENOUS

## 2019-06-06 MED ORDER — DEXMEDETOMIDINE HCL IN NACL 200 MCG/50ML IV SOLN
INTRAVENOUS | Status: AC
  Filled 2019-06-06: qty 50

## 2019-06-06 MED ORDER — DEXAMETHASONE SODIUM PHOSPHATE 10 MG/ML IJ SOLN
INTRAMUSCULAR | Status: DC | PRN
  Administered 2019-06-06: 4 mg via INTRAVENOUS

## 2019-06-06 MED ORDER — EPHEDRINE 5 MG/ML INJ
INTRAVENOUS | Status: AC
  Filled 2019-06-06: qty 30

## 2019-06-06 MED ORDER — GABAPENTIN 300 MG PO CAPS
300.0000 mg | ORAL_CAPSULE | ORAL | Status: AC
  Administered 2019-06-06: 300 mg via ORAL

## 2019-06-06 MED ORDER — KETOROLAC TROMETHAMINE 30 MG/ML IJ SOLN
INTRAMUSCULAR | Status: DC | PRN
  Administered 2019-06-06: 25 mg via INTRAVENOUS

## 2019-06-06 MED ORDER — KETOROLAC TROMETHAMINE 30 MG/ML IJ SOLN: 30.0000 mg | Freq: Once | INTRAMUSCULAR | Status: DC | PRN

## 2019-06-06 MED ORDER — PROMETHAZINE HCL 25 MG/ML IJ SOLN: 6.2500 mg | INTRAMUSCULAR | Status: DC | PRN

## 2019-06-06 MED ORDER — LIDOCAINE 2% (20 MG/ML) 5 ML SYRINGE
INTRAMUSCULAR | Status: AC
  Filled 2019-06-06: qty 25

## 2019-06-06 MED ORDER — MIDAZOLAM HCL 5 MG/5ML IJ SOLN
INTRAMUSCULAR | Status: DC | PRN
  Administered 2019-06-06: 2 mg via INTRAVENOUS

## 2019-06-06 MED ORDER — BUPIVACAINE-EPINEPHRINE 0.25% -1:200000 IJ SOLN
INTRAMUSCULAR | Status: DC | PRN
  Administered 2019-06-06: 10 mL

## 2019-06-06 MED ORDER — FENTANYL CITRATE (PF) 100 MCG/2ML IJ SOLN
INTRAMUSCULAR | Status: AC
  Filled 2019-06-06: qty 2

## 2019-06-06 MED ORDER — PHENYLEPHRINE 40 MCG/ML (10ML) SYRINGE FOR IV PUSH (FOR BLOOD PRESSURE SUPPORT)
PREFILLED_SYRINGE | INTRAVENOUS | Status: AC
  Filled 2019-06-06: qty 40

## 2019-06-06 MED ORDER — ONDANSETRON HCL 4 MG/2ML IJ SOLN
INTRAMUSCULAR | Status: AC
  Filled 2019-06-06: qty 10

## 2019-06-06 MED ORDER — GLYCOPYRROLATE PF 0.2 MG/ML IJ SOSY
PREFILLED_SYRINGE | INTRAMUSCULAR | Status: AC
  Filled 2019-06-06: qty 1

## 2019-06-06 MED ORDER — ACETAMINOPHEN 500 MG PO TABS
1000.0000 mg | ORAL_TABLET | ORAL | Status: AC
  Administered 2019-06-06: 1000 mg via ORAL

## 2019-06-06 MED ORDER — OXYCODONE HCL 5 MG PO TABS: 5.0000 mg | ORAL_TABLET | Freq: Once | ORAL | Status: DC | PRN

## 2019-06-06 MED ORDER — OXYCODONE HCL 5 MG/5ML PO SOLN: 5.0000 mg | Freq: Once | ORAL | Status: DC | PRN

## 2019-06-06 MED ORDER — SUCCINYLCHOLINE CHLORIDE 200 MG/10ML IV SOSY
PREFILLED_SYRINGE | INTRAVENOUS | Status: AC
  Filled 2019-06-06: qty 10

## 2019-06-06 MED ORDER — ACETAMINOPHEN 500 MG PO TABS
ORAL_TABLET | ORAL | Status: AC
  Filled 2019-06-06: qty 2

## 2019-06-06 MED ORDER — MIDAZOLAM HCL 2 MG/2ML IJ SOLN
INTRAMUSCULAR | Status: AC
  Filled 2019-06-06: qty 2

## 2019-06-06 MED ORDER — EPHEDRINE SULFATE 50 MG/ML IJ SOLN
INTRAMUSCULAR | Status: DC | PRN
  Administered 2019-06-06 (×2): 5 mg via INTRAVENOUS
  Administered 2019-06-06: 10 mg via INTRAVENOUS

## 2019-06-06 SURGICAL SUPPLY — 57 items
APL PRP STRL LF DISP 70% ISPRP (MISCELLANEOUS) ×1
APL SKNCLS STERI-STRIP NONHPOA (GAUZE/BANDAGES/DRESSINGS) ×1
APL SWBSTK 6 STRL LF DISP (MISCELLANEOUS)
APPLICATOR COTTON TIP 6 STRL (MISCELLANEOUS) IMPLANT
APPLICATOR COTTON TIP 6IN STRL (MISCELLANEOUS)
APPLIER CLIP 9.375 MED OPEN (MISCELLANEOUS)
APR CLP MED 9.3 20 MLT OPN (MISCELLANEOUS)
BENZOIN TINCTURE PRP APPL 2/3 (GAUZE/BANDAGES/DRESSINGS) ×3 IMPLANT
BLADE HEX COATED 2.75 (ELECTRODE) ×3 IMPLANT
BLADE SURG 15 STRL LF DISP TIS (BLADE) ×1 IMPLANT
BLADE SURG 15 STRL SS (BLADE) ×3
CANISTER SUCT 1200ML W/VALVE (MISCELLANEOUS) ×3 IMPLANT
CHLORAPREP W/TINT 26 (MISCELLANEOUS) ×3 IMPLANT
CLIP APPLIE 9.375 MED OPEN (MISCELLANEOUS) IMPLANT
CLOSURE WOUND 1/2 X4 (GAUZE/BANDAGES/DRESSINGS) ×1
COVER BACK TABLE REUSABLE LG (DRAPES) ×3 IMPLANT
COVER MAYO STAND REUSABLE (DRAPES) ×3 IMPLANT
COVER WAND RF STERILE (DRAPES) IMPLANT
DECANTER SPIKE VIAL GLASS SM (MISCELLANEOUS) IMPLANT
DRAPE LAPAROTOMY 100X72 PEDS (DRAPES) ×3 IMPLANT
DRAPE UTILITY XL STRL (DRAPES) ×3 IMPLANT
DRSG TEGADERM 4X4.75 (GAUZE/BANDAGES/DRESSINGS) ×3 IMPLANT
ELECT BLADE 4.0 EZ CLEAN MEGAD (MISCELLANEOUS) ×3
ELECT REM PT RETURN 9FT ADLT (ELECTROSURGICAL) ×3
ELECTRODE BLDE 4.0 EZ CLN MEGD (MISCELLANEOUS) IMPLANT
ELECTRODE REM PT RTRN 9FT ADLT (ELECTROSURGICAL) ×1 IMPLANT
GAUZE SPONGE 4X4 12PLY STRL LF (GAUZE/BANDAGES/DRESSINGS) ×3 IMPLANT
GLOVE BIO SURGEON STRL SZ7 (GLOVE) ×3 IMPLANT
GLOVE BIOGEL PI IND STRL 7.5 (GLOVE) ×1 IMPLANT
GLOVE BIOGEL PI IND STRL 8 (GLOVE) IMPLANT
GLOVE BIOGEL PI INDICATOR 7.5 (GLOVE) ×2
GLOVE BIOGEL PI INDICATOR 8 (GLOVE) ×2
GOWN STRL REUS W/ TWL LRG LVL3 (GOWN DISPOSABLE) ×2 IMPLANT
GOWN STRL REUS W/TWL LRG LVL3 (GOWN DISPOSABLE) ×6
ILLUMINATOR WAVEGUIDE N/F (MISCELLANEOUS) ×2 IMPLANT
KIT MARKER MARGIN INK (KITS) ×2 IMPLANT
LIGHT WAVEGUIDE WIDE FLAT (MISCELLANEOUS) IMPLANT
NDL HYPO 25X1 1.5 SAFETY (NEEDLE) ×1 IMPLANT
NEEDLE HYPO 25X1 1.5 SAFETY (NEEDLE) ×3 IMPLANT
NS IRRIG 1000ML POUR BTL (IV SOLUTION) ×3 IMPLANT
PACK BASIN DAY SURGERY FS (CUSTOM PROCEDURE TRAY) ×3 IMPLANT
PENCIL SMOKE EVACUATOR (MISCELLANEOUS) ×3 IMPLANT
SLEEVE SCD COMPRESS KNEE MED (MISCELLANEOUS) ×3 IMPLANT
SPONGE LAP 4X18 RFD (DISPOSABLE) ×3 IMPLANT
STRIP CLOSURE SKIN 1/2X4 (GAUZE/BANDAGES/DRESSINGS) ×2 IMPLANT
SUT CHROMIC 3 0 SH 27 (SUTURE) IMPLANT
SUT MON AB 4-0 PC3 18 (SUTURE) ×3 IMPLANT
SUT SILK 2 0 SH (SUTURE) IMPLANT
SUT VIC AB 3-0 SH 27 (SUTURE) ×3
SUT VIC AB 3-0 SH 27X BRD (SUTURE) ×1 IMPLANT
SYR BULB 3OZ (MISCELLANEOUS) ×3 IMPLANT
SYR CONTROL 10ML LL (SYRINGE) ×3 IMPLANT
TOWEL GREEN STERILE FF (TOWEL DISPOSABLE) ×3 IMPLANT
TRAY FAXITRON CT DISP (TRAY / TRAY PROCEDURE) IMPLANT
TUBE CONNECTING 20'X1/4 (TUBING) ×1
TUBE CONNECTING 20X1/4 (TUBING) ×2 IMPLANT
YANKAUER SUCT BULB TIP NO VENT (SUCTIONS) ×3 IMPLANT

## 2019-06-06 NOTE — Interval H&P Note (Signed)
History and Physical Interval Note:  06/06/2019 2:50 PM  Kara Mccullough  has presented today for surgery, with the diagnosis of LEFT BREAST MASS, HISTORY OF RIGHT BREAST DUCTAL CARCINOMA IN SITU.  The various methods of treatment have been discussed with the patient and family. After consideration of risks, benefits and other options for treatment, the patient has consented to  Procedure(s): LEFT BREAST LUMPECTOMY (Left) as a surgical intervention.  The patient's history has been reviewed, patient examined, no change in status, stable for surgery.  I have reviewed the patient's chart and labs.  Questions were answered to the patient's satisfaction.     Maia Petties

## 2019-06-06 NOTE — Op Note (Signed)
Preop diagnosis: Left breast mass Postop diagnosis: Same Procedure performed: Left breast lumpectomy Surgeon:Myrna Vonseggern K Cydnee Fuquay Anesthesia: General via LMA Indications:The patient is a 48 year old female who presents with a breast mass.She has a history of right breast DCIS s/p lumpectomy and radiation in 2016. She has very thin pectoralis muscle secondary to the retropectoral implants.   The patient underwent her annual diagnostic mammograms on 05/06/19. Usually she has these done in the spring but her mammograms were delayed by Covid. Imaging showed a mass in the medial left breast at 9:30 about 6 cm from the nipple. On ultrasound this measures 0.4 x 0.5 x 0.4 cm with spiculated margins. This was unable to be reached with biopsy due to the proximity of the implant. She is referred to [place] for lumpectomy for diagnosis.  Description of procedure: The patient was brought to the operating room and placed in the supine position on the operating room table.  After adequate level of general anesthesia was obtained, her left chest was prepped with ChloraPrep and draped in sterile fashion.  A timeout was taken to ensure the proper patient and proper procedure.  We made a curvilinear incision around the edge of the areola medially.  We dissected carefully through the dermis into the breast tissue.  The patient has a very thin layer of breast tissue which is probably only about 5 mm thick before you encounter a few strands of pectoralis muscle.  The implant is visible through the very attenuated pectoralis muscle.  However we never violated the capsule of the implant.  We dissected medially until we reached the area of the mass which was just at the medial edge of her breast.  The mass was grasped with a clamp and we excised the entire specimen.  Our specimen measures about 8 x 8 x 8 mm.  The small specimen was oriented with a paint kit and sent for pathologic examination.  No other palpable masses are noted.   Essentially the posterior surface of the dermis is resting on the pectoralis/implant.  There is no intervening breast tissue in this area.  We irrigated the wound thoroughly and inspected for hemostasis.  We closed the wound after infiltrating local anesthetic into the dermis.  We closed with a dermal layer of 3-0 Vicryl and a subcuticular layer of 4-0 Monocryl.  Benzoin and Steri-Strips were applied.  The patient was then extubated and brought to the recovery room in stable condition.  All sponge, instrument, and needle counts are correct.  Imogene Burn. Georgette Dover, MD, [facility]  General/ Trauma Surgery   06/06/2019 5:39 PM

## 2019-06-06 NOTE — Anesthesia Preprocedure Evaluation (Addendum)
Anesthesia Evaluation  Patient identified by MRN, date of birth, ID band Patient awake    Reviewed: Allergy & Precautions, NPO status , Patient's Chart, lab work & pertinent test results  Airway Mallampati: II  TM Distance: >3 FB Neck ROM: Full    Dental no notable dental hx. (+) Teeth Intact, Dental Advisory Given   Pulmonary neg pulmonary ROS,    Pulmonary exam normal breath sounds clear to auscultation       Cardiovascular negative cardio ROS Normal cardiovascular exam Rhythm:Regular Rate:Normal     Neuro/Psych negative neurological ROS  negative psych ROS   GI/Hepatic negative GI ROS, Neg liver ROS,   Endo/Other  negative endocrine ROS  Renal/GU negative Renal ROS     Musculoskeletal negative musculoskeletal ROS (+)   Abdominal Normal abdominal exam  (+)   Peds  Hematology negative hematology ROS (+)   Anesthesia Other Findings L breast mass, hx of DCIS right breast  Professional body builder  Reproductive/Obstetrics negative OB ROS                            Anesthesia Physical Anesthesia Plan  ASA: II  Anesthesia Plan: General   Post-op Pain Management:    Induction: Intravenous  PONV Risk Score and Plan: 3 and Ondansetron, Dexamethasone, Midazolam and Treatment may vary due to age or medical condition  Airway Management Planned: LMA  Additional Equipment: None  Intra-op Plan:   Post-operative Plan: Extubation in OR  Informed Consent: I have reviewed the patients History and Physical, chart, labs and discussed the procedure including the risks, benefits and alternatives for the proposed anesthesia with the patient or authorized representative who has indicated his/her understanding and acceptance.     Dental advisory given  Plan Discussed with: CRNA  Anesthesia Plan Comments:         Anesthesia Quick Evaluation

## 2019-06-06 NOTE — Transfer of Care (Signed)
Immediate Anesthesia Transfer of Care Note  Patient: Kara Mccullough  Procedure(s) Performed: LEFT BREAST LUMPECTOMY (Left Breast)  Patient Location: PACU  Anesthesia Type:General  Level of Consciousness: drowsy  Airway & Oxygen Therapy: Patient Spontanous Breathing and Patient connected to face mask oxygen  Post-op Assessment: Report given to RN and Post -op Vital signs reviewed and stable  Post vital signs: Reviewed and stable  Last Vitals:  Vitals Value Taken Time  BP 123/81 06/06/19 1739  Temp    Pulse 72 06/06/19 1741  Resp 11 06/06/19 1741  SpO2 100 % 06/06/19 1741  Vitals shown include unvalidated device data.  Last Pain:  Vitals:   06/06/19 1413  TempSrc: Oral  PainSc: 0-No pain         Complications: No apparent anesthesia complications

## 2019-06-06 NOTE — Anesthesia Procedure Notes (Signed)
Procedure Name: LMA Insertion Date/Time: 06/06/2019 4:41 PM Performed by: Lavonia Dana, CRNA Pre-anesthesia Checklist: Patient identified, Emergency Drugs available, Suction available and Patient being monitored Patient Re-evaluated:Patient Re-evaluated prior to induction Oxygen Delivery Method: Circle system utilized Preoxygenation: Pre-oxygenation with 100% oxygen Induction Type: IV induction Ventilation: Mask ventilation without difficulty LMA: LMA inserted LMA Size: 4.0 Number of attempts: 1 Airway Equipment and Method: Bite block Placement Confirmation: positive ETCO2 Tube secured with: Tape Dental Injury: Teeth and Oropharynx as per pre-operative assessment

## 2019-06-06 NOTE — Discharge Instructions (Signed)
[facility] °Office Phone Number [phone] ° °BREAST BIOPSY/ PARTIAL MASTECTOMY: POST OP INSTRUCTIONS ° °Always review your discharge instruction sheet given to you by the facility where your surgery was performed. ° °IF YOU HAVE DISABILITY OR FAMILY LEAVE FORMS, YOU MUST BRING THEM TO THE OFFICE FOR PROCESSING.  DO NOT GIVE THEM TO YOUR DOCTOR. ° °1. A prescription for pain medication may be given to you upon discharge.  Take your pain medication as prescribed, if needed.  If narcotic pain medicine is not needed, then you may take acetaminophen (Tylenol) or ibuprofen (Advil) as needed. °2. Take your usually prescribed medications unless otherwise directed °3. If you need a refill on your pain medication, please contact your pharmacy.  They will contact our office to request authorization.  Prescriptions will not be filled after 5pm or on week-ends. °4. You should eat very light the first 24 hours after surgery, such as soup, crackers, pudding, etc.  Resume your normal diet the day after surgery. °5. Most patients will experience some swelling and bruising in the breast.  Ice packs and a good support bra will help.  Swelling and bruising can take several days to resolve.  °6. It is common to experience some constipation if taking pain medication after surgery.  Increasing fluid intake and taking a stool softener will usually help or prevent this problem from occurring.  A mild laxative (Milk of Magnesia or Miralax) should be taken according to package directions if there are no bowel movements after 48 hours. °7. Unless discharge instructions indicate otherwise, you may remove your bandages 24-48 hours after surgery, and you may shower at that time.  You may have steri-strips (small skin tapes) in place directly over the incision.  These strips should be left on the skin for 7-10 days.  If your surgeon used skin glue on the incision, you may shower in 24 hours.  The glue will flake off over the  next 2-3 weeks.  Any sutures or staples will be removed at the office during your follow-up visit. °8. ACTIVITIES:  You may resume regular daily activities (gradually increasing) beginning the next day.  Wearing a good support bra or sports bra minimizes pain and swelling.  You may have sexual intercourse when it is comfortable. °a. You may drive when you no longer are taking prescription pain medication, you can comfortably wear a seatbelt, and you can safely maneuver your car and apply brakes. °b. RETURN TO WORK:  ______________________________________________________________________________________ °9. You should see your doctor in the office for a follow-up appointment approximately two weeks after your surgery.  Your doctor’s nurse will typically make your follow-up appointment when she calls you with your pathology report.  Expect your pathology report 2-3 business days after your surgery.  You may call to check if you do not hear from us after three days. °10. OTHER INSTRUCTIONS: _______________________________________________________________________________________________ _____________________________________________________________________________________________________________________________________ °_____________________________________________________________________________________________________________________________________ °_____________________________________________________________________________________________________________________________________ ° °WHEN TO CALL YOUR DOCTOR: °1. Fever over 101.0 °2. Nausea and/or vomiting. °3. Extreme swelling or bruising. °4. Continued bleeding from incision. °5. Increased pain, redness, or drainage from the incision. ° °The clinic staff is available to answer your questions during regular business hours.  Please don’t hesitate to call and ask to speak to one of the nurses for clinical concerns.  If you have a medical emergency, go to the nearest  emergency room or call 911.  A surgeon from [facility] is always on call at the hospital. ° °For further questions, please visit [email or web address]  ° ° ° ° °  Post Anesthesia Home Care Instructions ° °Activity: °Get plenty of rest for the remainder of the day. A responsible individual must stay with you for 24 hours following the procedure.  °For the next 24 hours, DO NOT: °-Drive a car °-Operate machinery °-Drink alcoholic beverages °-Take any medication unless instructed by your physician °-Make any legal decisions or sign important papers. ° °Meals: °Start with liquid foods such as gelatin or soup. Progress to regular foods as tolerated. Avoid greasy, spicy, heavy foods. If nausea and/or vomiting occur, drink only clear liquids until the nausea and/or vomiting subsides. Call your physician if vomiting continues. ° °Special Instructions/Symptoms: °Your throat may feel dry or sore from the anesthesia or the breathing tube placed in your throat during surgery. If this causes discomfort, gargle with warm salt water. The discomfort should disappear within 24 hours. ° °If you had a scopolamine patch placed behind your ear for the management of post- operative nausea and/or vomiting: ° °1. The medication in the patch is effective for 72 hours, after which it should be removed.  Wrap patch in a tissue and discard in the trash. Wash hands thoroughly with soap and water. °2. You may remove the patch earlier than 72 hours if you experience unpleasant side effects which may include dry mouth, dizziness or visual disturbances. °3. Avoid touching the patch. Wash your hands with soap and water after contact with the patch. °  ° °

## 2019-06-06 NOTE — Anesthesia Postprocedure Evaluation (Signed)
Anesthesia Post Note  Patient: Kara Mccullough  Procedure(s) Performed: LEFT BREAST LUMPECTOMY (Left Breast)     Patient location during evaluation: PACU Anesthesia Type: General Level of consciousness: awake and alert, oriented and patient cooperative Pain management: pain level controlled Vital Signs Assessment: post-procedure vital signs reviewed and stable Respiratory status: spontaneous breathing, nonlabored ventilation and respiratory function stable Cardiovascular status: blood pressure returned to baseline and stable Postop Assessment: no apparent nausea or vomiting Anesthetic complications: no    Last Vitals:  Vitals:   06/06/19 1740 06/06/19 1745  BP:  124/81  Pulse: 75 67  Resp: 12 13  Temp: 36.4 C   SpO2: 100% 100%    Last Pain:  Vitals:   06/06/19 1740  TempSrc:   PainSc: 0-No pain                 Pervis Hocking

## 2019-06-07 ENCOUNTER — Encounter (HOSPITAL_BASED_OUTPATIENT_CLINIC_OR_DEPARTMENT_OTHER): Payer: Self-pay | Admitting: Surgery

## 2019-06-10 LAB — SURGICAL PATHOLOGY

## 2022-03-21 ENCOUNTER — Encounter (INDEPENDENT_AMBULATORY_CARE_PROVIDER_SITE_OTHER): Payer: No Typology Code available for payment source | Admitting: Ophthalmology

## 2022-03-21 DIAGNOSIS — H33302 Unspecified retinal break, left eye: Secondary | ICD-10-CM | POA: Diagnosis not present

## 2022-03-21 DIAGNOSIS — H43813 Vitreous degeneration, bilateral: Secondary | ICD-10-CM | POA: Diagnosis not present

## 2022-04-04 ENCOUNTER — Encounter (INDEPENDENT_AMBULATORY_CARE_PROVIDER_SITE_OTHER): Payer: Self-pay

## 2022-04-04 ENCOUNTER — Encounter (INDEPENDENT_AMBULATORY_CARE_PROVIDER_SITE_OTHER): Payer: No Typology Code available for payment source | Admitting: Ophthalmology

## 2022-04-06 ENCOUNTER — Encounter (INDEPENDENT_AMBULATORY_CARE_PROVIDER_SITE_OTHER): Payer: No Typology Code available for payment source | Admitting: Ophthalmology

## 2022-04-06 DIAGNOSIS — H33302 Unspecified retinal break, left eye: Secondary | ICD-10-CM

## 2022-05-13 ENCOUNTER — Other Ambulatory Visit: Payer: Self-pay | Admitting: Obstetrics and Gynecology

## 2022-05-13 DIAGNOSIS — Z1231 Encounter for screening mammogram for malignant neoplasm of breast: Secondary | ICD-10-CM

## 2022-07-06 ENCOUNTER — Ambulatory Visit
Admission: RE | Admit: 2022-07-06 | Discharge: 2022-07-06 | Disposition: A | Discharge status: Home / Self Care | Payer: 59 | Source: Ambulatory Visit | Attending: Obstetrics and Gynecology | Admitting: Obstetrics and Gynecology

## 2022-07-06 DIAGNOSIS — Z1231 Encounter for screening mammogram for malignant neoplasm of breast: Secondary | ICD-10-CM

## 2022-08-04 ENCOUNTER — Encounter (INDEPENDENT_AMBULATORY_CARE_PROVIDER_SITE_OTHER): Payer: 59 | Admitting: Ophthalmology

## 2022-08-09 ENCOUNTER — Encounter (INDEPENDENT_AMBULATORY_CARE_PROVIDER_SITE_OTHER): Payer: 59 | Admitting: Ophthalmology

## 2022-08-19 ENCOUNTER — Encounter (INDEPENDENT_AMBULATORY_CARE_PROVIDER_SITE_OTHER): Payer: 59 | Admitting: Ophthalmology

## 2022-08-19 DIAGNOSIS — H33302 Unspecified retinal break, left eye: Secondary | ICD-10-CM

## 2022-08-19 DIAGNOSIS — H43813 Vitreous degeneration, bilateral: Secondary | ICD-10-CM | POA: Diagnosis not present

## 2023-08-21 ENCOUNTER — Encounter (INDEPENDENT_AMBULATORY_CARE_PROVIDER_SITE_OTHER): Payer: Self-pay

## 2023-08-21 ENCOUNTER — Encounter (INDEPENDENT_AMBULATORY_CARE_PROVIDER_SITE_OTHER): Payer: 59 | Admitting: Ophthalmology

## 2023-09-04 ENCOUNTER — Encounter (INDEPENDENT_AMBULATORY_CARE_PROVIDER_SITE_OTHER): Payer: 59 | Admitting: Ophthalmology

## 2023-09-04 ENCOUNTER — Encounter (INDEPENDENT_AMBULATORY_CARE_PROVIDER_SITE_OTHER): Payer: Self-pay

## 2023-10-09 ENCOUNTER — Encounter (INDEPENDENT_AMBULATORY_CARE_PROVIDER_SITE_OTHER): Payer: 59 | Admitting: Ophthalmology

## 2023-10-09 DIAGNOSIS — H33302 Unspecified retinal break, left eye: Secondary | ICD-10-CM

## 2023-10-09 DIAGNOSIS — H43813 Vitreous degeneration, bilateral: Secondary | ICD-10-CM | POA: Diagnosis not present

## 2023-12-22 ENCOUNTER — Encounter (INDEPENDENT_AMBULATORY_CARE_PROVIDER_SITE_OTHER): Admitting: Ophthalmology

## 2024-01-05 ENCOUNTER — Other Ambulatory Visit: Payer: Self-pay | Admitting: Obstetrics and Gynecology

## 2024-01-05 DIAGNOSIS — Z1231 Encounter for screening mammogram for malignant neoplasm of breast: Secondary | ICD-10-CM

## 2024-01-19 ENCOUNTER — Encounter (INDEPENDENT_AMBULATORY_CARE_PROVIDER_SITE_OTHER): Admitting: Ophthalmology

## 2024-03-08 ENCOUNTER — Encounter (INDEPENDENT_AMBULATORY_CARE_PROVIDER_SITE_OTHER): Admitting: Ophthalmology

## 2024-03-08 DIAGNOSIS — H33301 Unspecified retinal break, right eye: Secondary | ICD-10-CM

## 2024-03-29 ENCOUNTER — Encounter (INDEPENDENT_AMBULATORY_CARE_PROVIDER_SITE_OTHER): Admitting: Ophthalmology

## 2024-03-29 DIAGNOSIS — H33301 Unspecified retinal break, right eye: Secondary | ICD-10-CM

## 2024-07-08 ENCOUNTER — Ambulatory Visit

## 2024-07-24 ENCOUNTER — Ambulatory Visit
Admission: RE | Admit: 2024-07-24 | Discharge: 2024-07-24 | Disposition: A | Discharge status: Home / Self Care | Source: Ambulatory Visit | Attending: Obstetrics and Gynecology | Admitting: Obstetrics and Gynecology

## 2024-07-24 DIAGNOSIS — Z1231 Encounter for screening mammogram for malignant neoplasm of breast: Secondary | ICD-10-CM

## 2024-08-02 ENCOUNTER — Encounter (INDEPENDENT_AMBULATORY_CARE_PROVIDER_SITE_OTHER): Admitting: Ophthalmology

## 2024-08-13 NOTE — Addendum Note (Signed)
 Encounter addended by: Wenceslao Sos, RT on: 08/13/2024 9:01 AM  Actions taken: Imaging Exam ended, Image imported

## 2024-09-27 ENCOUNTER — Encounter (INDEPENDENT_AMBULATORY_CARE_PROVIDER_SITE_OTHER): Admitting: Ophthalmology
